# Patient Record
Sex: Male | Born: 1990
Health system: Southern US, Community
[De-identification: ages and names within clinical notes are randomized; demographics above are authoritative.]

## PROBLEM LIST (undated history)

## (undated) DIAGNOSIS — F909 Attention-deficit hyperactivity disorder, unspecified type: Secondary | ICD-10-CM

## (undated) DIAGNOSIS — F418 Other specified anxiety disorders: Secondary | ICD-10-CM

## (undated) DIAGNOSIS — K649 Unspecified hemorrhoids: Secondary | ICD-10-CM

## (undated) DIAGNOSIS — F458 Other somatoform disorders: Secondary | ICD-10-CM

## (undated) DIAGNOSIS — L0591 Pilonidal cyst without abscess: Secondary | ICD-10-CM

## (undated) DIAGNOSIS — K760 Fatty (change of) liver, not elsewhere classified: Secondary | ICD-10-CM

## (undated) DIAGNOSIS — I10 Essential (primary) hypertension: Secondary | ICD-10-CM

## (undated) DIAGNOSIS — J45909 Unspecified asthma, uncomplicated: Secondary | ICD-10-CM

## (undated) HISTORY — DX: Pilonidal cyst without abscess: L05.91

## (undated) HISTORY — DX: Other specified anxiety disorders: F41.8

## (undated) HISTORY — PX: CYSTECTOMY: SUR359

## (undated) HISTORY — DX: Attention-deficit hyperactivity disorder, unspecified type: F90.9

## (undated) HISTORY — PX: OTHER SURGICAL HISTORY: SHX169

## (undated) HISTORY — PX: APPENDECTOMY: SHX54

## (undated) HISTORY — PX: WISDOM TOOTH EXTRACTION: SHX21

## (undated) HISTORY — DX: Essential (primary) hypertension: I10

## (undated) HISTORY — DX: Fatty (change of) liver, not elsewhere classified: K76.0

## (undated) HISTORY — DX: Other somatoform disorders: F45.8

## (undated) HISTORY — DX: Unspecified asthma, uncomplicated: J45.909

## (undated) HISTORY — DX: Unspecified hemorrhoids: K64.9

---

## 2013-02-09 ENCOUNTER — Telehealth: Payer: Self-pay

## 2013-02-09 NOTE — Telephone Encounter (Signed)
Left message for call back. Identifiable.   

## 2013-02-09 NOTE — Telephone Encounter (Signed)
Patient not available at the time of phone.  Wife, Raymond Santana, said to call him back later today around 4pm.

## 2013-02-10 ENCOUNTER — Ambulatory Visit: Payer: Self-pay | Admitting: Family Medicine

## 2013-02-14 NOTE — Telephone Encounter (Signed)
Appt. cancelled and rescheduled for 02/18/13 @ 1:30pm.

## 2013-02-17 ENCOUNTER — Telehealth: Payer: Self-pay

## 2013-02-17 NOTE — Telephone Encounter (Signed)
Medication List and allergies:  Updated and Reviewed  90 day supply/mail order: n/a Local prescriptions:  [facility] in [place], [place]  Immunization due: Influenza-declined  A/P: Personal, family or PSH: Updated Flu- Declined Tdap-10/07/2010    To discuss with provider: Lower back pain x 6 months

## 2013-02-18 ENCOUNTER — Encounter: Payer: Self-pay | Admitting: Family Medicine

## 2013-02-18 ENCOUNTER — Ambulatory Visit (INDEPENDENT_AMBULATORY_CARE_PROVIDER_SITE_OTHER): Payer: 59 | Admitting: Family Medicine

## 2013-02-18 VITALS — BP 129/84 | HR 100 | Temp 98.2°F | Resp 16 | Ht 72.0 in | Wt 295.5 lb

## 2013-02-18 DIAGNOSIS — M545 Low back pain, unspecified: Secondary | ICD-10-CM

## 2013-02-18 MED ORDER — METHYLPREDNISOLONE ACETATE 80 MG/ML IJ SUSP
80.0000 mg | Freq: Once | INTRAMUSCULAR | Status: AC
  Administered 2013-02-18: 80 mg via INTRAMUSCULAR

## 2013-02-18 MED ORDER — METHOCARBAMOL 750 MG PO TABS: 750.0000 mg | ORAL_TABLET | Freq: Three times a day (TID) | ORAL | Status: DC

## 2013-02-18 MED ORDER — PREDNISONE 10 MG PO TABS: ORAL_TABLET | ORAL | Status: DC

## 2013-02-18 NOTE — Assessment & Plan Note (Signed)
New.  Pt's sxs have been worsening x6 months.  Start Prednisone taper, muscle relaxers, refer to ortho.  Reviewed supportive care and red flags that should prompt return.  Pt expressed understanding and is in agreement w/ plan.

## 2013-02-18 NOTE — Progress Notes (Signed)
Pre visit review using our clinic review tool, if applicable. No additional management support is needed unless otherwise documented below in the visit note. 

## 2013-02-18 NOTE — Patient Instructions (Signed)
Schedule your complete physical at your convenience Start the Prednisone tomorrow morning- take w/ food Use the Robaxin as needed for pain Alternate ice/heat for pain relief We'll call you with your ortho appt Call with any questions or concerns Hang in there!!

## 2013-02-18 NOTE — Progress Notes (Signed)
   Subjective:    Patient ID: Raymond Santana, male    DOB: 10/01/1990, 23 y.o.   MRN: [id number]  HPI New to establish.  Back pain- pt was lifting heavy TV ~6 months ago and thought he pulled a muscle.  Pain has since worsened.  Now having severe pain w/ forward flexion, sitting, lifting.  Baseline pain is ~5 but will shoot to 9-10 w/ certain movements, 'will take my breath away'.  Using icyhot, tiger balm, heating pad w/ minimal relief.  Pain is radiating from low back upwards.  No weakness or numbness of legs.  No bowel or bladder incontinence.  Pt reports ibuprofen has been ineffective.  Took mom's muscle relaxer yesterday (thinks it was Flexeril) w/o relief.   Review of Systems For ROS see HPI     Objective:   Physical Exam  Vitals reviewed. Constitutional: He is oriented to person, place, and time. He appears well-developed and well-nourished.  Obviously uncomfortable  Musculoskeletal: He exhibits tenderness (over spine at L1, pain w/ flexion worse than extension.  unable to sit w/o pain.  (-) SLR). He exhibits no edema.  Neurological: He is alert and oriented to person, place, and time. He has normal reflexes. No cranial nerve deficit. Coordination normal.  Skin: Skin is warm and dry.          Assessment & Plan:

## 2013-02-23 ENCOUNTER — Telehealth: Payer: Self-pay

## 2013-02-23 NOTE — Telephone Encounter (Signed)
The patient's wife called and stated she believed the pt has a chiropractor apt sometime today.  She does not know when or where the apt is, so she is hoping whoever made the apt could call the pt back with more info , thanks!    Husband call back - [phone]

## 2013-02-23 NOTE — Telephone Encounter (Signed)
Spoke w/pt and gave him appt/contact info.

## 2013-09-15 ENCOUNTER — Encounter: Payer: Self-pay | Admitting: Medical

## 2013-09-15 ENCOUNTER — Ambulatory Visit (INDEPENDENT_AMBULATORY_CARE_PROVIDER_SITE_OTHER): Payer: 59 | Admitting: Medical

## 2013-09-15 VITALS — BP 117/82 | HR 95 | Temp 97.8°F | Ht 71.5 in | Wt 286.6 lb

## 2013-09-15 DIAGNOSIS — J029 Acute pharyngitis, unspecified: Secondary | ICD-10-CM

## 2013-09-15 DIAGNOSIS — R062 Wheezing: Secondary | ICD-10-CM

## 2013-09-15 LAB — POCT RAPID STREP A (OFFICE): RAPID STREP A SCREEN: NEGATIVE

## 2013-09-15 MED ORDER — ALBUTEROL SULFATE HFA 108 (90 BASE) MCG/ACT IN AERS
2.0000 | INHALATION_SPRAY | Freq: Four times a day (QID) | RESPIRATORY_TRACT | Status: DC | PRN
Start: 2013-09-15 — End: 2015-02-02

## 2013-09-15 MED ORDER — CEFDINIR 300 MG PO CAPS: 300.0000 mg | ORAL_CAPSULE | Freq: Two times a day (BID) | ORAL | Status: DC

## 2013-09-15 NOTE — Assessment & Plan Note (Signed)
History of asthma. Lungs sounded clear on exam presently. Prescription of albuterol. I advised him that if he gets worse wheezing or if he has to take albuterol every 6 hours to notify us.

## 2013-09-15 NOTE — Patient Instructions (Signed)
Your appear have probable strep throat based on your symptoms and appearance by exam. I prescribed cefdinir and sent med  to your pharmacy. For your mild wheeze last night and history of asthma, I am prescribing albuterol. If you have to use inhaler every 6 hours then notify us and would call in additional medications. Follow up in 7 days or as needed.

## 2013-09-15 NOTE — Assessment & Plan Note (Signed)
Based on his exam and clinical presentation I decided to go ahead and give him cefdinir antibiotic. This is despite rapid strep test negative.

## 2013-09-15 NOTE — Progress Notes (Signed)
   Subjective:    Patient ID: Raymond Santana, male    DOB: 1990-12-12, 23 y.o.   MRN: [id number]  HPI  Pt states that his throat feels sore. Hurts to swallow. Symptoms came on yesterday. Pt does have body aches and fatigue. No nasal congestion. No sore throat in family or friends.   Pt states feels like wheezing a little bit. He has history of asthma. Last excacerbation 10 years ago.    Review of Systems  Constitutional: Positive for chills and fatigue. Negative for fever.  HENT: Positive for sore throat. Negative for drooling, facial swelling, mouth sores, nosebleeds, postnasal drip, rhinorrhea, sinus pressure, sneezing, tinnitus, trouble swallowing and voice change.   Respiratory: Positive for wheezing. Negative for cough and choking.        Mild wheeze last night. None presently and he does have a history of asthma  Cardiovascular: Negative for chest pain.  Gastrointestinal: Negative.   Genitourinary: Negative.   Musculoskeletal: Negative for arthralgias, back pain and neck pain.  Neurological: Negative.   Hematological: Negative for adenopathy. Does not bruise/bleed easily.       Objective:   Physical Exam  General-no acute distress, pleasant patient. Although during the exam when he swallows he appears to have some pain. Neck-full range of motion, no nuchal rigidity. Bilateral mild tender submandibular nodes. Also in the mid anterior cervical node region on the right side he has a slight tender area on palpation but I do not feel a lymph node. Lungs-clear even and unlabored Heart-regular, rate and rhythm HEENT-no frontal or maxillary sinus pressure to palpation. Ear exams-canals are clear and TMs are normal. Posterior pharynx exam shows moderate hypertrophy with bright redness but no exudate. Uvula midline. Neurologic exam-cranial nerves III through XII grossly intact.         Assessment & Plan:

## 2014-02-06 ENCOUNTER — Encounter: Payer: Self-pay | Admitting: Medical

## 2014-02-06 ENCOUNTER — Ambulatory Visit (INDEPENDENT_AMBULATORY_CARE_PROVIDER_SITE_OTHER): Payer: Self-pay | Admitting: Medical

## 2014-02-06 VITALS — BP 128/85 | HR 110 | Temp 99.2°F | Ht 71.5 in | Wt 278.4 lb

## 2014-02-06 DIAGNOSIS — J020 Streptococcal pharyngitis: Secondary | ICD-10-CM

## 2014-02-06 DIAGNOSIS — J029 Acute pharyngitis, unspecified: Secondary | ICD-10-CM

## 2014-02-06 DIAGNOSIS — R509 Fever, unspecified: Secondary | ICD-10-CM

## 2014-02-06 MED ORDER — PENICILLIN G BENZATHINE 1200000 UNIT/2ML IM SUSP
1.2000 10*6.[IU] | Freq: Once | INTRAMUSCULAR | Status: AC
  Administered 2014-02-06: 1.2 10*6.[IU] via INTRAMUSCULAR

## 2014-02-06 MED ORDER — AZITHROMYCIN 250 MG PO TABS: ORAL_TABLET | ORAL | Status: DC

## 2014-02-06 NOTE — Progress Notes (Signed)
Pre visit review using our clinic review tool, if applicable. No additional management support is needed unless otherwise documented below in the visit note. 

## 2014-02-06 NOTE — Patient Instructions (Signed)
Your strep test was positive. I am prescribing bicillin in office. And rx of azithromycin to start tomorrow.  Rest hydrate, tylenol for fever and warm salt water gargles. Follow up in 7 days or as needed.

## 2014-02-06 NOTE — Assessment & Plan Note (Addendum)
Your strep test was positive. I am prescribing bicillin in office. And rx of azithromycin to start tomorrow.  Rest hydrate, tylenol for fever and warm salt water gargles

## 2014-02-06 NOTE — Progress Notes (Signed)
Subjective:    Patient ID: Raymond Santana, male    DOB: [birth year], 24 y.o.   MRN: [id number]  HPI  Pt has sore throat for 2  days.  Associated symptom.  Body aches-yes Fever-yes Chills-yes HA-Moderate ha Neck symptoms-pain anterior neck radiating to his ears. Lymph node enlargement-yes Rash-no Painful swallowing-yes Recent strep contact-no       Review of Systems  Constitutional: Positive for fever, chills and fatigue.  HENT: Positive for sore throat.   Respiratory: Negative for cough, choking, shortness of breath and wheezing.   Cardiovascular: Negative for chest pain and palpitations.  Gastrointestinal: Negative for abdominal pain.  Musculoskeletal: Negative for back pain and neck pain.  Hematological: Positive for adenopathy. Does not bruise/bleed easily.  Psychiatric/Behavioral: Negative for behavioral problems and confusion.   Past Medical History  Diagnosis Date  . Asthma   . Hypertension     History   Social History  . Marital Status: Married    Spouse Name: N/A    Number of Children: N/A  . Years of Education: N/A   Occupational History  . Not on file.   Social History Main Topics  . Smoking status: Never Smoker   . Smokeless tobacco: Never Used  . Alcohol Use: Yes  . Drug Use: No  . Sexual Activity: Yes   Other Topics Concern  . Not on file   Social History Narrative    Past Surgical History  Procedure Laterality Date  . Appendectomy    . Cystectomy      Buttock  . Staph infection      L toe    Family History  Problem Relation Age of Onset  . Diabetes      father's side of family  . Diabetes Mother     mother's side of family  . Hypertension Mother     mother's side of family  . Colon cancer Paternal Grandmother   . Cancer Other     great grandmother    No Known Allergies  Current Outpatient Prescriptions on File Prior to Visit  Medication Sig Dispense Refill  . albuterol (PROVENTIL HFA;VENTOLIN HFA) 108 (90  BASE) MCG/ACT inhaler Inhale 2 puffs into the lungs every 6 (six) hours as needed for wheezing or shortness of breath. 1 Inhaler 0  . cefdinir (OMNICEF) 300 MG capsule Take 1 capsule (300 mg total) by mouth 2 (two) times daily. 20 capsule 0  . ibuprofen (ADVIL,MOTRIN) 200 MG tablet Take 200 mg by mouth every 6 (six) hours as needed.    . methocarbamol (ROBAXIN) 750 MG tablet Take 1 tablet (750 mg total) by mouth 3 (three) times daily. (Patient not taking: Reported on 02/06/2014) 60 tablet 0  . predniSONE (DELTASONE) 10 MG tablet 3 tabs x3 days and then 2 tabs x3 days and then 1 tab x3 days.  Take w/ food. (Patient not taking: Reported on 02/06/2014) 18 tablet 0   No current facility-administered medications on file prior to visit.    BP 128/85 mmHg  Pulse 110  Temp(Src) 99.2 F (37.3 C) (Oral)  Ht 5' 11.5" (1.816 m)  Wt 278 lb 6.4 oz (126.281 kg)  BMI 38.29 kg/m2  SpO2 98%      Objective:   Physical Exam   General- No acute distress, pleasant pt.  Neck- from, No nuccal rigidity, Mild submandibular node hypertrophy.  Lungs- Clear even and unlabored.  Heart- Regular, rate and rhythm. HEENT- Head- normocephalic Eyes- PEERL bilaterally. Ears- Canals clear, normal tm's bilaterally.  Nose- No frontal or maxillary sinus tenderness to palpation. Turbinates normal. Throat- posterior pharynx shows   tonsillar hypertrophy 2+  plus,  bright  erythma,  Discharge some bilateral.   Neurologic- CN III- XII grossly intact.       Assessment & Plan:

## 2014-07-11 ENCOUNTER — Emergency Department (HOSPITAL_BASED_OUTPATIENT_CLINIC_OR_DEPARTMENT_OTHER)
Admission: EM | Admit: 2014-07-11 | Discharge: 2014-07-11 | Disposition: A | Discharge status: Home / Self Care | Payer: 59 | Attending: Emergency Medicine | Admitting: Emergency Medicine

## 2014-07-11 ENCOUNTER — Emergency Department (HOSPITAL_BASED_OUTPATIENT_CLINIC_OR_DEPARTMENT_OTHER): Payer: 59

## 2014-07-11 ENCOUNTER — Ambulatory Visit (INDEPENDENT_AMBULATORY_CARE_PROVIDER_SITE_OTHER): Payer: 59 | Admitting: Family Medicine

## 2014-07-11 ENCOUNTER — Encounter: Payer: Self-pay | Admitting: Family Medicine

## 2014-07-11 ENCOUNTER — Encounter (HOSPITAL_BASED_OUTPATIENT_CLINIC_OR_DEPARTMENT_OTHER): Payer: Self-pay

## 2014-07-11 VITALS — BP 136/82 | HR 128 | Temp 98.8°F | Resp 16 | Wt 290.5 lb

## 2014-07-11 DIAGNOSIS — K529 Noninfective gastroenteritis and colitis, unspecified: Secondary | ICD-10-CM | POA: Diagnosis not present

## 2014-07-11 DIAGNOSIS — R1030 Lower abdominal pain, unspecified: Secondary | ICD-10-CM | POA: Diagnosis not present

## 2014-07-11 DIAGNOSIS — R197 Diarrhea, unspecified: Secondary | ICD-10-CM | POA: Diagnosis present

## 2014-07-11 DIAGNOSIS — J45909 Unspecified asthma, uncomplicated: Secondary | ICD-10-CM | POA: Insufficient documentation

## 2014-07-11 DIAGNOSIS — I10 Essential (primary) hypertension: Secondary | ICD-10-CM | POA: Insufficient documentation

## 2014-07-11 DIAGNOSIS — Z79899 Other long term (current) drug therapy: Secondary | ICD-10-CM | POA: Diagnosis not present

## 2014-07-11 DIAGNOSIS — R000 Tachycardia, unspecified: Secondary | ICD-10-CM | POA: Diagnosis not present

## 2014-07-11 DIAGNOSIS — A09 Infectious gastroenteritis and colitis, unspecified: Secondary | ICD-10-CM | POA: Diagnosis not present

## 2014-07-11 LAB — COMPREHENSIVE METABOLIC PANEL
ALK PHOS: 66 U/L (ref 38–126)
ALT: 64 U/L — AB (ref 17–63)
AST: 40 U/L (ref 15–41)
Albumin: 4.4 g/dL (ref 3.5–5.0)
Anion gap: 14 (ref 5–15)
BUN: 14 mg/dL (ref 6–20)
CO2: 21 mmol/L — AB (ref 22–32)
Calcium: 9.4 mg/dL (ref 8.9–10.3)
Chloride: 99 mmol/L — ABNORMAL LOW (ref 101–111)
Creatinine, Ser: 1.12 mg/dL (ref 0.61–1.24)
GFR calc Af Amer: >60 mL/min (ref >60)
GFR calc non Af Amer: >60 mL/min (ref >60)
Glucose, Bld: 84 mg/dL (ref 65–99)
Potassium: 3.6 mmol/L (ref 3.5–5.1)
SODIUM: 134 mmol/L — AB (ref 135–145)
TOTAL PROTEIN: 8.3 g/dL — AB (ref 6.5–8.1)
Total Bilirubin: 1 mg/dL (ref 0.3–1.2)

## 2014-07-11 LAB — CBC
HCT: 48.3 % (ref 39.0–52.0)
Hemoglobin: 16.5 g/dL (ref 13.0–17.0)
MCH: 28.4 pg (ref 26.0–34.0)
MCHC: 34.2 g/dL (ref 30.0–36.0)
MCV: 83.3 fL (ref 78.0–100.0)
Platelets: 263 10*3/uL (ref 150–400)
RBC: 5.8 MIL/uL (ref 4.22–5.81)
RDW: 14.3 % (ref 11.5–15.5)
WBC: 9.1 10*3/uL (ref 4.0–10.5)

## 2014-07-11 LAB — LIPASE, BLOOD: Lipase: 16 U/L — ABNORMAL LOW (ref 22–51)

## 2014-07-11 LAB — OCCULT BLOOD X 1 CARD TO LAB, STOOL: Fecal Occult Bld: POSITIVE — AB

## 2014-07-11 MED ORDER — IOHEXOL 300 MG/ML  SOLN
100.0000 mL | Freq: Once | INTRAMUSCULAR | Status: AC | PRN
  Administered 2014-07-11: 100 mL via INTRAVENOUS

## 2014-07-11 MED ORDER — DICYCLOMINE HCL 10 MG PO CAPS
10.0000 mg | ORAL_CAPSULE | Freq: Once | ORAL | Status: AC
  Administered 2014-07-11: 10 mg via ORAL
  Filled 2014-07-11: qty 1

## 2014-07-11 MED ORDER — CIPROFLOXACIN HCL 500 MG PO TABS: 500.0000 mg | ORAL_TABLET | Freq: Two times a day (BID) | ORAL | Status: DC

## 2014-07-11 MED ORDER — SODIUM CHLORIDE 0.9 % IV BOLUS (SEPSIS)
500.0000 mL | Freq: Once | INTRAVENOUS | Status: AC
  Administered 2014-07-11: 500 mL via INTRAVENOUS

## 2014-07-11 MED ORDER — SODIUM CHLORIDE 0.9 % IV BOLUS (SEPSIS)
1000.0000 mL | Freq: Once | INTRAVENOUS | Status: AC
  Administered 2014-07-11: 1000 mL via INTRAVENOUS

## 2014-07-11 MED ORDER — IOHEXOL 300 MG/ML  SOLN
25.0000 mL | Freq: Once | INTRAMUSCULAR | Status: AC | PRN
  Administered 2014-07-11: 25 mL via ORAL

## 2014-07-11 MED ORDER — METRONIDAZOLE 500 MG PO TABS: 500.0000 mg | ORAL_TABLET | Freq: Two times a day (BID) | ORAL | Status: DC

## 2014-07-11 NOTE — Discharge Instructions (Signed)
Take both antibiotics as directed for one week. You may also take immodium over the counter. Stay well hydrated.  Diarrhea Diarrhea is frequent loose and watery bowel movements. It can cause you to feel weak and dehydrated. Dehydration can cause you to become tired and thirsty, have a dry mouth, and have decreased urination that often is dark yellow. Diarrhea is a sign of another problem, most often an infection that will not last long. In most cases, diarrhea typically lasts 2-3 days. However, it can last longer if it is a sign of something more serious. It is important to treat your diarrhea as directed by your caregiver to lessen or prevent future episodes of diarrhea. CAUSES  Some common causes include:  Gastrointestinal infections caused by viruses, bacteria, or parasites.  Food poisoning or food allergies.  Certain medicines, such as antibiotics, chemotherapy, and laxatives.  Artificial sweeteners and fructose.  Digestive disorders. HOME CARE INSTRUCTIONS  Ensure adequate fluid intake (hydration): Have 1 cup (8 oz) of fluid for each diarrhea episode. Avoid fluids that contain simple sugars or sports drinks, fruit juices, whole milk products, and sodas. Your urine should be clear or pale yellow if you are drinking enough fluids. Hydrate with an oral rehydration solution that you can purchase at pharmacies, retail stores, and online. You can prepare an oral rehydration solution at home by mixing the following ingredients together:   - tsp table salt.   tsp baking soda.   tsp salt substitute containing potassium chloride.  1  tablespoons sugar.  1 L (34 oz) of water.  Certain foods and beverages may increase the speed at which food moves through the gastrointestinal (GI) tract. These foods and beverages should be avoided and include:  Caffeinated and alcoholic beverages.  High-fiber foods, such as raw fruits and vegetables, nuts, seeds, and whole grain breads and  cereals.  Foods and beverages sweetened with sugar alcohols, such as xylitol, sorbitol, and mannitol.  Some foods may be well tolerated and may help thicken stool including:  Starchy foods, such as rice, toast, pasta, low-sugar cereal, oatmeal, grits, baked potatoes, crackers, and bagels.  Bananas.  Applesauce.  Add probiotic-rich foods to help increase healthy bacteria in the GI tract, such as yogurt and fermented milk products.  Wash your hands well after each diarrhea episode.  Only take over-the-counter or prescription medicines as directed by your caregiver.  Take a warm bath to relieve any burning or pain from frequent diarrhea episodes. SEEK IMMEDIATE MEDICAL CARE IF:   You are unable to keep fluids down.  You have persistent vomiting.  You have blood in your stool, or your stools are black and tarry.  You do not urinate in 6-8 hours, or there is only a small amount of very dark urine.  You have abdominal pain that increases or localizes.  You have weakness, dizziness, confusion, or light-headedness.  You have a severe headache.  Your diarrhea gets worse or does not get better.  You have a fever or persistent symptoms for more than 2-3 days.  You have a fever and your symptoms suddenly get worse. MAKE SURE YOU:   Understand these instructions.  Will watch your condition.  Will get help right away if you are not doing well or get worse. Document Released: 12/13/2001 Document Revised: 05/09/2013 Document Reviewed: 08/31/2011 [facility] Patient Information 2015 [place], [place]. This information is not intended to replace advice given to you by your health care provider. Make sure you discuss any questions you have with your  health care provider.  Bloody Diarrhea Bloody diarrhea can be caused by many different conditions. Most of the time bloody diarrhea is the result of food poisoning or minor infections. Bloody diarrhea usually improves over 2 to 3 days of rest  and fluid replacement. Other conditions that can cause bloody diarrhea include:  Internal bleeding.  Infection.  Diseases of the bowel and colon. Internal bleeding from an ulcer or bowel disease can be severe and requires hospital care or even surgery. DIAGNOSIS  To find out what is wrong your caregiver may check your:  Stool.  Blood.  Results from a test that looks inside the body (endoscopy). TREATMENT   Get plenty of rest.  Drink enough water and fluids to keep your urine clear or pale yellow.  Do not smoke.  Solid foods and dairy products should be avoided until your illness improves.  As you improve, slowly return to a regular diet with easily-digested foods first. Examples are:  Bananas.  Rice.  Toast.  Crackers. You should only need these for about 2 days before adding more normal foods to your diet.  Avoid spicy or fatty foods as well as caffeine and alcohol for several days.  Medicine to control cramping and diarrhea can relieve symptoms but may prolong some cases of bloody diarrhea. Antibiotics can speed recovery from diarrhea due to some bacterial infections. Call your caregiver if diarrhea does not get better in 3 days. SEEK MEDICAL CARE IF:   You do not improve after 3 days.  Your diarrhea improves but your stool appears black. SEEK IMMEDIATE MEDICAL CARE IF:   You become extremely weak or faint.  You become very sweaty.  You have increased pain or bleeding.  You develop repeated vomiting.  You vomit and you see blood or the vomit looks black in color.  You have a fever. Document Released: 12/23/2004 Document Revised: 03/17/2011 Document Reviewed: 11/24/2008 [facility] Patient Information 2015 [place], [place]. This information is not intended to replace advice given to you by your health care provider. Make sure you discuss any questions you have with your health care provider.  Food Choices to Help Relieve Diarrhea When you have diarrhea, the  foods you eat and your eating habits are very important. Choosing the right foods and drinks can help relieve diarrhea. Also, because diarrhea can last up to 7 days, you need to replace lost fluids and electrolytes (such as sodium, potassium, and chloride) in order to help prevent dehydration.  WHAT GENERAL GUIDELINES DO I NEED TO FOLLOW?  Slowly drink 1 cup (8 oz) of fluid for each episode of diarrhea. If you are getting enough fluid, your urine will be clear or pale yellow.  Eat starchy foods. Some good choices include white rice, white toast, pasta, low-fiber cereal, baked potatoes (without the skin), saltine crackers, and bagels.  Avoid large servings of any cooked vegetables.  Limit fruit to two servings per day. A serving is  cup or 1 small piece.  Choose foods with less than 2 g of fiber per serving.  Limit fats to less than 8 tsp (38 g) per day.  Avoid fried foods.  Eat foods that have probiotics in them. Probiotics can be found in certain dairy products.  Avoid foods and beverages that may increase the speed at which food moves through the stomach and intestines (gastrointestinal tract). Things to avoid include:  High-fiber foods, such as dried fruit, raw fruits and vegetables, nuts, seeds, and whole grain foods.  Spicy foods and high-fat  foods.  Foods and beverages sweetened with high-fructose corn syrup, honey, or sugar alcohols such as xylitol, sorbitol, and mannitol. WHAT FOODS ARE RECOMMENDED? Grains White rice. White, [place], or pita breads (fresh or toasted), including plain rolls, buns, or bagels. White pasta. Saltine, soda, or graham crackers. Pretzels. Low-fiber cereal. Cooked cereals made with water (such as cornmeal, farina, or cream cereals). Plain muffins. Matzo. Melba toast. Zwieback.  Vegetables Potatoes (without the skin). Strained tomato and vegetable juices. Most well-cooked and canned vegetables without seeds. Tender lettuce. Fruits Cooked or canned  applesauce, apricots, cherries, fruit cocktail, grapefruit, peaches, pears, or plums. Fresh bananas, apples without skin, cherries, grapes, cantaloupe, grapefruit, peaches, oranges, or plums.  Meat and Other Protein Products Baked or boiled chicken. Eggs. Tofu. Fish. Seafood. Smooth peanut butter. Ground or well-cooked tender beef, ham, veal, lamb, pork, or poultry.  Dairy Plain yogurt, kefir, and unsweetened liquid yogurt. Lactose-free milk, buttermilk, or soy milk. Plain hard cheese. Beverages Sport drinks. Clear broths. Diluted fruit juices (except prune). Regular, caffeine-free sodas such as ginger ale. Water. Decaffeinated teas. Oral rehydration solutions. Sugar-free beverages not sweetened with sugar alcohols. Other Bouillon, broth, or soups made from recommended foods.  The items listed above may not be a complete list of recommended foods or beverages. Contact your dietitian for more options. WHAT FOODS ARE NOT RECOMMENDED? Grains Whole grain, whole wheat, bran, or rye breads, rolls, pastas, crackers, and cereals. Wild or brown rice. Cereals that contain more than 2 g of fiber per serving. Corn tortillas or taco shells. Cooked or dry oatmeal. Granola. Popcorn. Vegetables Raw vegetables. Cabbage, broccoli, Brussels sprouts, artichokes, baked beans, beet greens, corn, kale, legumes, peas, sweet potatoes, and yams. Potato skins. Cooked spinach and cabbage. Fruits Dried fruit, including raisins and dates. Raw fruits. Stewed or dried prunes. Fresh apples with skin, apricots, mangoes, pears, raspberries, and strawberries.  Meat and Other Protein Products Chunky peanut butter. Nuts and seeds. Beans and lentils. Tomasa BlaseBacon.  Dairy High-fat cheeses. Milk, chocolate milk, and beverages made with milk, such as milk shakes. Cream. Ice cream. Sweets and Desserts Sweet rolls, doughnuts, and sweet breads. Pancakes and waffles. Fats and Oils Butter. Cream sauces. Margarine. Salad oils. Plain salad  dressings. Olives. Avocados.  Beverages Caffeinated beverages (such as coffee, tea, soda, or energy drinks). Alcoholic beverages. Fruit juices with pulp. Prune juice. Soft drinks sweetened with high-fructose corn syrup or sugar alcohols. Other Coconut. Hot sauce. Chili powder. Mayonnaise. Gravy. Cream-based or milk-based soups.  The items listed above may not be a complete list of foods and beverages to avoid. Contact your dietitian for more information. WHAT SHOULD I DO IF I BECOME DEHYDRATED? Diarrhea can sometimes lead to dehydration. Signs of dehydration include dark urine and dry mouth and skin. If you think you are dehydrated, you should rehydrate with an oral rehydration solution. These solutions can be purchased at pharmacies, retail stores, or online.  Drink -1 cup (120-240 mL) of oral rehydration solution each time you have an episode of diarrhea. If drinking this amount makes your diarrhea worse, try drinking smaller amounts more often. For example, drink 1-3 tsp (5-15 mL) every 5-10 minutes.  A general rule for staying hydrated is to drink 1-2 L of fluid per day. Talk to your health care provider about the specific amount you should be drinking each day. Drink enough fluids to keep your urine clear or pale yellow. Document Released: 03/15/2003 Document Revised: 12/28/2012 Document Reviewed: 11/15/2012 [facility] Patient Information 2015 [place], [place]. This information is not intended  to replace advice given to you by your health care provider. Make sure you discuss any questions you have with your health care provider.  Colitis Colitis is inflammation of the colon. Colitis can be a short-term or long-standing (chronic) illness. Crohn's disease and ulcerative colitis are 2 types of colitis which are chronic. They usually require lifelong treatment. CAUSES  There are many different causes of colitis, including:  Viruses.  Germs (bacteria).  Medicine reactions. SYMPTOMS    Diarrhea.  Intestinal bleeding.  Pain.  Fever.  Throwing up (vomiting).  Tiredness (fatigue).  Weight loss.  Bowel blockage. DIAGNOSIS  The diagnosis of colitis is based on examination and stool or blood tests. X-rays, CT scan, and colonoscopy may also be needed. TREATMENT  Treatment may include:  Fluids given through the vein (intravenously).  Bowel rest (nothing to eat or drink for a period of time).  Medicine for pain and diarrhea.  Medicines (antibiotics) that kill germs.  Cortisone medicines.  Surgery. HOME CARE INSTRUCTIONS   Get plenty of rest.  Drink enough water and fluids to keep your urine clear or pale yellow.  Eat a well-balanced diet.  Call your caregiver for follow-up as recommended. SEEK IMMEDIATE MEDICAL CARE IF:   You develop chills.  You have an oral temperature above 102 F (38.9 C), not controlled by medicine.  You have extreme weakness, fainting, or dehydration.  You have repeated vomiting.  You develop severe belly (abdominal) pain or are passing bloody or tarry stools. MAKE SURE YOU:   Understand these instructions.  Will watch your condition.  Will get help right away if you are not doing well or get worse. Document Released: 01/31/2004 Document Revised: 03/17/2011 Document Reviewed: 04/27/2009 [facility] Patient Information 2015 [place], [place]. This information is not intended to replace advice given to you by your health care provider. Make sure you discuss any questions you have with your health care provider.

## 2014-07-11 NOTE — Assessment & Plan Note (Signed)
New.  Pt is unable to eat or drink w/o bloody diarrhea.  Given fevers, presence of blood, and lack of others w/ similar sxs, I suspect pt has food poisoning or other infectious process.  Pt is tachycardic to 130 bpm.  Due to this, will send him to ER downstairs for IV fluids and additional tx.  Reviewed supportive care and red flags that should prompt return.  Pt expressed understanding and is in agreement w/ plan.

## 2014-07-11 NOTE — Addendum Note (Signed)
Addended by: Eustace QuailEABOLD, Tabytha Gradillas J on: 07/11/2014 01:43 PM   Modules accepted: Orders

## 2014-07-11 NOTE — ED Notes (Signed)
PA at bedside.

## 2014-07-11 NOTE — ED Notes (Signed)
Pt reports 3 days of diarrhea, initially with fever and chills but not current.  Diffuse abd pain, no n/v.

## 2014-07-11 NOTE — Patient Instructions (Signed)
Please go downstairs for IV fluids and additional treatment We will run the stool studies to definitively know what's causing your symptoms Depending on what the ER prescribes for you, let me know so I can make cost effective adjustments if needed Call with any questions or concerns Hang in there!!!

## 2014-07-11 NOTE — ED Provider Notes (Signed)
CSN: [id number]     Arrival date & time 07/11/14  1346 History   First MD Initiated Contact with Patient 07/11/14 1355     Chief Complaint  Patient presents with  . Diarrhea     (Consider location/radiation/quality/duration/timing/severity/associated sxs/prior Treatment) HPI Comments: 24 year old male presenting with diarrhea 3 days. Reports multiple episodes of mucousy, watery diarrhea that appears like "beef stew" every 5-10 minutes. States he has noticed small streaks of dark red blood. 3 days ago, before the onset of diarrhea, he had [place] food. His wife had the same and is not feeling sick. No recent travel or antibiotic use. Admits to subjective fever and chills along with generalized, cramping abdominal pain, unrelieved by Pepto-Bismol or ibuprofen. Denies nausea or vomiting. Went to his PCPs office prior to coming to the ED who is located upstairs, she collected a stool sample and sent him to the ED for rehydration.  Patient is a 24 y.o. male presenting with diarrhea. The history is provided by the patient.  Diarrhea Quality:  Mucous, watery and blood-tinged Severity:  Moderate Onset quality:  Gradual Number of episodes:  Multiple Duration:  3 days Progression:  Worsening Relieved by:  Nothing Associated symptoms: abdominal pain, chills and fever   Risk factors: no recent antibiotic use, no sick contacts and no travel to endemic areas     Past Medical History  Diagnosis Date  . Asthma   . Hypertension    Past Surgical History  Procedure Laterality Date  . Appendectomy    . Cystectomy      Buttock  . Staph infection      L toe   Family History  Problem Relation Age of Onset  . Diabetes      father's side of family  . Diabetes Mother     mother's side of family  . Hypertension Mother     mother's side of family  . Colon cancer Paternal Grandmother   . Cancer Other     great grandmother   History  Substance Use Topics  . Smoking status: Never Smoker   .  Smokeless tobacco: Never Used  . Alcohol Use: Yes    Review of Systems  Constitutional: Positive for fever and chills.  Gastrointestinal: Positive for abdominal pain and diarrhea.  All other systems reviewed and are negative.     Allergies  Review of patient's allergies indicates no known allergies.  Home Medications   Prior to Admission medications   Medication Sig Start Date End Date Taking? Authorizing Provider  albuterol (PROVENTIL HFA;VENTOLIN HFA) 108 (90 BASE) MCG/ACT inhaler Inhale 2 puffs into the lungs every 6 (six) hours as needed for wheezing or shortness of breath. 09/15/13   Ramon Dredge Saguier, PA-C  ciprofloxacin (CIPRO) 500 MG tablet Take 1 tablet (500 mg total) by mouth 2 (two) times daily. One po bid x 7 days 07/11/14   Kathrynn Speed, PA-C  metroNIDAZOLE (FLAGYL) 500 MG tablet Take 1 tablet (500 mg total) by mouth 2 (two) times daily. One po bid x 7 days 07/11/14   Nada Boozer Shaniqwa Horsman, PA-C   BP 117/70 mmHg  Pulse 103  Temp(Src) 98.6 F (37 C) (Oral)  Resp 20  Ht 6' (1.829 m)  Wt 290 lb 8 oz (131.77 kg)  BMI 39.39 kg/m2  SpO2 100% Physical Exam  Constitutional: He is oriented to person, place, and time. He appears well-developed and well-nourished. No distress.  HENT:  Head: Normocephalic and atraumatic.  Eyes: Conjunctivae and EOM are normal.  Neck: Normal range of motion. Neck supple.  Cardiovascular: Regular rhythm and normal heart sounds.   Tachycardic.  Pulmonary/Chest: Effort normal and breath sounds normal.  Abdominal: Normal appearance and bowel sounds are normal. He exhibits no distension. There is tenderness. There is no rigidity, no rebound, no guarding, no tenderness at McBurney's point and negative Murphy's sign.  Tenderness across lower abdomen. No peritoneal signs.  Musculoskeletal: Normal range of motion. He exhibits no edema.  Neurological: He is alert and oriented to person, place, and time.  Skin: Skin is warm and dry.  Psychiatric: He has a normal  mood and affect. His behavior is normal.  Nursing note and vitals reviewed.   ED Course  Procedures (including critical care time) Labs Review Labs Reviewed  COMPREHENSIVE METABOLIC PANEL - Abnormal; Notable for the following:    Sodium 134 (*)    Chloride 99 (*)    CO2 21 (*)    Total Protein 8.3 (*)    ALT 64 (*)    All other components within normal limits  LIPASE, BLOOD - Abnormal; Notable for the following:    Lipase 16 (*)    All other components within normal limits  OCCULT BLOOD X 1 CARD TO LAB, STOOL - Abnormal; Notable for the following:    Fecal Occult Bld POSITIVE (*)    All other components within normal limits  CBC    Imaging Review Ct Abdomen Pelvis W Contrast  07/11/2014   CLINICAL DATA:  Generalized abdominal pain starting Saturday night, nausea, diarrhea, blood in the stool  EXAM: CT ABDOMEN AND PELVIS WITH CONTRAST  TECHNIQUE: Multidetector CT imaging of the abdomen and pelvis was performed using the standard protocol following bolus administration of intravenous contrast.  CONTRAST:  100mL OMNIPAQUE IOHEXOL 300 MG/ML SOLN, 25mL OMNIPAQUE IOHEXOL 300 MG/ML SOLN  COMPARISON:  10/24/2010  FINDINGS: Lung bases are unremarkable. Sagittal images of the spine are unremarkable. Fatty infiltration of the liver is noted. No focal hepatic mass. No calcified gallstones are noted within gallbladder. The pancreas, spleen and adrenal glands are unremarkable. Kidneys are symmetrical in size and enhancement. No hydronephrosis or hydroureter.  There is no small bowel obstruction. No ascites or free air. There is nonspecific mild thickening of the colonic wall in right colon transverse colon and descending colon. Small nonspecific lymph nodes are noted in right lower quadrant mesentery the largest measures 1.1 cm. Some liquid stool noted in sigmoid colon suspicious for diarrhea. Minimal enhancement of the mucosa sigmoid colon. Diffuse mild colitis cannot be excluded. Clinical correlation is  necessary. There is no colonic obstruction. No pelvic ascites or adenopathy. No inguinal adenopathy. No destructive bony lesions are noted within pelvis.  IMPRESSION: 1. Fatty infiltration of the liver. 2. There is mild thickening of colonic wall in right colon transverse colon and descending colon. Some liquid stool noted within sigmoid colon suspicious for diarrhea. Diffuse mild colitis cannot be excluded. Clinical correlation is necessary. 3. Small nonspecific lymph nodes are noted in right lower quadrant mesentery the largest measures 1.1 cm borderline by size criteria, this may be reactive. 4. No small bowel obstruction. 5. No hydronephrosis or hydroureter. 6. No ascites or free air.   Electronically Signed   By: Natasha MeadLiviu  Pop M.D.   On: 07/11/2014 17:11     EKG Interpretation None      MDM   Final diagnoses:  Colitis  Bloody diarrhea   Nontoxic appearing, NAD. Afebrile. Tachycardic, vitals otherwise stable. Sent from PCP for rehydration and further  evaluation of diarrhea. Stool cultures pending from PCP as stated above. Abdomen is soft with tenderness across lower abdomen, no peritoneal signs. Patient given IV fluids. Labs significant for mildly elevated ALT, no other acute findings. No leukocytosis. Stool hemoccult-positive. Bentyl given for abdominal cramping with great improvement of his symptoms, until patient went to have another bowel movement, and he started to develop cramping, left upper abdominal pain radiating down to his left lower quadrant. On exam, he has tenderness in left lower quadrant. At this time, plan to obtain CT to evaluate for possible diverticulitis. 4:16 PM  5:22 PM CT results as stated above. Will treat with cipro/flagyl. BRAT diet. F/u with PCP within 2-3 days. stable for d/c. Return precautions given. Patient states understanding of treatment care plan and is agreeable.  Kathrynn Speed, PA-C 07/11/14 1724  Raeford Razor, MD 07/12/14 [phone]

## 2014-07-11 NOTE — Progress Notes (Signed)
Pre visit review using our clinic review tool, if applicable. No additional management support is needed unless otherwise documented below in the visit note. 

## 2014-07-11 NOTE — ED Notes (Signed)
Patient transported to CT 

## 2014-07-11 NOTE — Progress Notes (Signed)
   Subjective:    Patient ID: Raymond Santana, male    DOB: 1990-10-27, 24 y.o.   MRN: [id number]  HPI Diarrhea- sxs started Saturday night w/ chills.  Then developed 'really bad' abd cramps.  + fever on Saturday night, doesn't remember the temp.  Has been trying to eat BRAT diet and 'within 5-10 minutes I'm going to the bathroom'.  Pain starts epigastric and radiates into lower abd.  Pain is rated 8-10/10.  No known sick contacts.  Ate Mexican earlier in the evening when sxs started but other diners did not get ill.  + blood in stool.  + nausea, no vomiting.  Pt is stooling every 15 minutes even w/o eating/drinking..   Review of Systems For ROS see HPI     Objective:   Physical Exam  Constitutional: He is oriented to person, place, and time. He appears well-developed and well-nourished. He appears distressed.  HENT:  Head: Normocephalic and atraumatic.  Cardiovascular: Regular rhythm and normal heart sounds.   Tachy but regular  Pulmonary/Chest: Effort normal and breath sounds normal. No respiratory distress. He has no wheezes. He has no rales.  Abdominal: Soft. He exhibits no distension. There is no tenderness. There is no rebound.  Hypoactive bowel sounds  Neurological: He is alert and oriented to person, place, and time.  Skin: Skin is warm and dry.  Psychiatric: He has a normal mood and affect. His behavior is normal.  Vitals reviewed.         Assessment & Plan:

## 2014-07-12 LAB — C. DIFFICILE GDH AND TOXIN A/B
C. difficile GDH: NOT DETECTED
C. difficile Toxin A/B: NOT DETECTED

## 2014-07-12 LAB — FECAL LACTOFERRIN, QUANT: Lactoferrin: POSITIVE

## 2014-07-18 ENCOUNTER — Telehealth: Payer: Self-pay | Admitting: Family Medicine

## 2014-07-18 NOTE — Telephone Encounter (Signed)
Called pt and advised on results.

## 2014-07-18 NOTE — Telephone Encounter (Signed)
Relation to pt: self Call back number: [phone]   Reason for call:  Pt inquiring about stool results

## 2014-08-14 LAB — STOOL CULTURE

## 2015-01-07 DIAGNOSIS — K649 Unspecified hemorrhoids: Secondary | ICD-10-CM

## 2015-01-07 HISTORY — DX: Unspecified hemorrhoids: K64.9

## 2015-02-02 ENCOUNTER — Encounter: Payer: Self-pay | Admitting: Family Medicine

## 2015-02-02 ENCOUNTER — Ambulatory Visit (INDEPENDENT_AMBULATORY_CARE_PROVIDER_SITE_OTHER): Payer: 59 | Admitting: Family Medicine

## 2015-02-02 ENCOUNTER — Other Ambulatory Visit: Payer: Self-pay

## 2015-02-02 VITALS — BP 132/90 | HR 102 | Temp 98.0°F | Ht 72.0 in | Wt 319.6 lb

## 2015-02-02 DIAGNOSIS — Z0000 Encounter for general adult medical examination without abnormal findings: Secondary | ICD-10-CM | POA: Insufficient documentation

## 2015-02-02 LAB — CBC WITH DIFFERENTIAL/PLATELET
BASOS PCT: 0 % (ref 0–1)
Basophils Absolute: 0 10*3/uL (ref 0.0–0.1)
Eosinophils Absolute: 0.1 10*3/uL (ref 0.0–0.7)
Eosinophils Relative: 1 % (ref 0–5)
HCT: 44.4 % (ref 39.0–52.0)
Hemoglobin: 15 g/dL (ref 13.0–17.0)
Lymphocytes Relative: 38 % (ref 12–46)
Lymphs Abs: 3.3 10*3/uL (ref 0.7–4.0)
MCH: 27.8 pg (ref 26.0–34.0)
MCHC: 33.8 g/dL (ref 30.0–36.0)
MCV: 82.4 fL (ref 78.0–100.0)
MONO ABS: 1.1 10*3/uL — AB (ref 0.1–1.0)
MPV: 9.4 fL (ref 8.6–12.4)
Monocytes Relative: 12 % (ref 3–12)
Neutro Abs: 4.3 10*3/uL (ref 1.7–7.7)
Neutrophils Relative %: 49 % (ref 43–77)
PLATELETS: 290 10*3/uL (ref 150–400)
RBC: 5.39 MIL/uL (ref 4.22–5.81)
RDW: 14.5 % (ref 11.5–15.5)
WBC: 8.8 10*3/uL (ref 4.0–10.5)

## 2015-02-02 LAB — HEPATIC FUNCTION PANEL
ALT: 75 U/L — ABNORMAL HIGH (ref 9–46)
AST: 41 U/L — ABNORMAL HIGH (ref 10–40)
Albumin: 4.4 g/dL (ref 3.6–5.1)
Alkaline Phosphatase: 81 U/L (ref 40–115)
Bilirubin, Direct: 0.1 mg/dL (ref <=0.2)
Indirect Bilirubin: 0.2 mg/dL (ref 0.2–1.2)
Total Bilirubin: 0.3 mg/dL (ref 0.2–1.2)
Total Protein: 7.1 g/dL (ref 6.1–8.1)

## 2015-02-02 LAB — BASIC METABOLIC PANEL
BUN: 11 mg/dL (ref 7–25)
CALCIUM: 8.9 mg/dL (ref 8.6–10.3)
CO2: 24 mmol/L (ref 20–31)
Chloride: 105 mmol/L (ref 98–110)
Creat: 0.91 mg/dL (ref 0.60–1.35)
GLUCOSE: 75 mg/dL (ref 65–99)
Potassium: 4.3 mmol/L (ref 3.5–5.3)
SODIUM: 139 mmol/L (ref 135–146)

## 2015-02-02 LAB — TSH: TSH: 1.958 u[IU]/mL (ref 0.350–4.500)

## 2015-02-02 LAB — LIPID PANEL
CHOLESTEROL: 211 mg/dL — AB (ref 125–200)
HDL: 25 mg/dL — AB (ref >=40)
LDL Cholesterol: 130 mg/dL — ABNORMAL HIGH (ref <130)
TRIGLYCERIDES: 281 mg/dL — AB (ref <150)
Total CHOL/HDL Ratio: 8.4 Ratio — ABNORMAL HIGH (ref <=5.0)
VLDL: 56 mg/dL — AB (ref <30)

## 2015-02-02 MED ORDER — ALBUTEROL SULFATE HFA 108 (90 BASE) MCG/ACT IN AERS: 2.0000 | INHALATION_SPRAY | Freq: Four times a day (QID) | RESPIRATORY_TRACT | Status: DC | PRN

## 2015-02-02 MED ORDER — PANTOPRAZOLE SODIUM 40 MG PO TBEC: 40.0000 mg | DELAYED_RELEASE_TABLET | Freq: Every day | ORAL | Status: DC

## 2015-02-02 NOTE — Patient Instructions (Signed)
Schedule a fasting lab visit at your convenience Start the Protonix daily to decrease the acid production Continue to work on healthy diet and regular exercise- you can do it! If your stomach pain changes or worsens- please let me know! Call with any questions or concerns If you want to join [place] at the new [place] office, any scheduled appointments will automatically transfer and we will see you at [address] [place] [address], [place], [place] [address] [facility]) Have a great weekend!!!

## 2015-02-02 NOTE — Progress Notes (Signed)
Pre visit review using our clinic review tool, if applicable. No additional management support is needed unless otherwise documented below in the visit note. 

## 2015-02-02 NOTE — Progress Notes (Signed)
   Subjective:    Patient ID: Raymond Santana, male    DOB: 1990/05/30, 25 y.o.   MRN: [id number]  HPI CPE- pt reports he will need to have a bowel movement ~10 minutes after eating.  Stools are loose.  Occasional cramping.  Pt will have pain under both ribs w/ extended sitting.  + GERD- having sour brash at night x6 months.  Pt attributes a lot of his sxs to weight gain (~30 lbs)   Review of Systems Patient reports no vision/hearing changes, anorexia, fever ,adenopathy, persistant/recurrent hoarseness, swallowing issues, chest pain, palpitations, edema, persistant/recurrent cough, hemoptysis, dyspnea (rest,exertional, paroxysmal nocturnal), gastrointestinal  bleeding (melena, rectal bleeding), GU symptoms (dysuria, hematuria, voiding/incontinence issues) syncope, focal weakness, memory loss, skin/hair/nail changes, depression, anxiety, abnormal bruising/bleeding, musculoskeletal symptoms/signs.   + tingling of L hand and arm in ulnar distribution intermittently    Objective:   Physical Exam General Appearance:    Alert, cooperative, no distress, appears stated age, obese  Head:    Normocephalic, without obvious abnormality, atraumatic  Eyes:    PERRL, conjunctiva/corneas clear, EOM's intact, fundi    benign, both eyes       Ears:    Normal TM's and external ear canals, both ears  Nose:   Nares normal, septum midline, mucosa normal, no drainage   or sinus tenderness  Throat:   Lips, mucosa, and tongue normal; teeth and gums normal  Neck:   Supple, symmetrical, trachea midline, no adenopathy;       thyroid:  No enlargement/tenderness/nodules  Back:     Symmetric, no curvature, ROM normal, no CVA tenderness  Lungs:     Clear to auscultation bilaterally, respirations unlabored  Chest wall:    No tenderness or deformity  Heart:    Regular rate and rhythm, S1 and S2 normal, no murmur, rub   or gallop  Abdomen:     Soft, non-tender, bowel sounds active all four quadrants,    no masses, no  organomegaly  Genitalia:    Pt declined  Rectal:    Extremities:   Extremities normal, atraumatic, no cyanosis or edema  Pulses:   2+ and symmetric all extremities  Skin:   Skin color, texture, turgor normal, no rashes or lesions  Lymph nodes:   Cervical, supraclavicular, and axillary nodes normal  Neurologic:   CNII-XII intact. Normal strength, sensation and reflexes      throughout          Assessment & Plan:

## 2015-02-04 NOTE — Assessment & Plan Note (Signed)
Pt's PE WNL w/ exception of obesity.  Check labs.  Stressed importance of healthy diet and regular exercise.  Anticipatory guidance provided.

## 2015-02-05 ENCOUNTER — Other Ambulatory Visit: Payer: Self-pay

## 2015-02-05 MED ORDER — ALBUTEROL SULFATE HFA 108 (90 BASE) MCG/ACT IN AERS: 2.0000 | INHALATION_SPRAY | Freq: Four times a day (QID) | RESPIRATORY_TRACT | Status: DC | PRN

## 2015-02-07 ENCOUNTER — Telehealth: Payer: Self-pay

## 2015-02-07 NOTE — Telephone Encounter (Signed)
Spoke with Raymond Santana at the pharmacy and she will make the change of the medication to the patient.

## 2015-02-07 NOTE — Telephone Encounter (Signed)
Spoke with provider and he gave Verbal ok to change to [employer].

## 2015-02-09 ENCOUNTER — Encounter: Payer: Self-pay | Admitting: General Practice

## 2015-02-09 ENCOUNTER — Other Ambulatory Visit: Payer: Self-pay | Admitting: Family Medicine

## 2015-02-09 DIAGNOSIS — R7989 Other specified abnormal findings of blood chemistry: Secondary | ICD-10-CM

## 2015-02-09 DIAGNOSIS — R945 Abnormal results of liver function studies: Secondary | ICD-10-CM

## 2015-03-02 ENCOUNTER — Telehealth: Payer: Self-pay | Admitting: Family Medicine

## 2015-03-02 ENCOUNTER — Other Ambulatory Visit: Payer: Self-pay

## 2015-03-02 MED ORDER — PANTOPRAZOLE SODIUM 40 MG PO TBEC: 40.0000 mg | DELAYED_RELEASE_TABLET | Freq: Every day | ORAL | Status: DC

## 2015-03-02 MED ORDER — ALBUTEROL SULFATE HFA 108 (90 BASE) MCG/ACT IN AERS: 2.0000 | INHALATION_SPRAY | Freq: Four times a day (QID) | RESPIRATORY_TRACT | Status: DC | PRN

## 2015-03-02 NOTE — Telephone Encounter (Signed)
Medication filled per patient request. 

## 2015-03-02 NOTE — Telephone Encounter (Signed)
Caller name: Marchelle Folks   Relationship to patient: Spouse   Can be reached: [phone]  Pharmacy: [id number] - [place], [place] - [address]  Reason for call: pt's spouse called in because she says that they now have new insurance on Monday and would like to see if PCP could call in PROTONIX again for the pt.  ALSO, she says that pt need a refill on his albuterol.    Please assist further.

## 2015-03-07 ENCOUNTER — Ambulatory Visit (INDEPENDENT_AMBULATORY_CARE_PROVIDER_SITE_OTHER): Payer: 59 | Admitting: Medical

## 2015-03-07 ENCOUNTER — Encounter: Payer: Self-pay | Admitting: Medical

## 2015-03-07 VITALS — BP 130/88 | HR 120 | Temp 98.5°F | Resp 16 | Ht 72.0 in | Wt 320.0 lb

## 2015-03-07 DIAGNOSIS — R05 Cough: Secondary | ICD-10-CM

## 2015-03-07 DIAGNOSIS — J028 Acute pharyngitis due to other specified organisms: Secondary | ICD-10-CM | POA: Diagnosis not present

## 2015-03-07 DIAGNOSIS — M609 Myositis, unspecified: Secondary | ICD-10-CM

## 2015-03-07 DIAGNOSIS — IMO0001 Reserved for inherently not codable concepts without codable children: Secondary | ICD-10-CM

## 2015-03-07 DIAGNOSIS — B950 Streptococcus, group A, as the cause of diseases classified elsewhere: Secondary | ICD-10-CM

## 2015-03-07 DIAGNOSIS — J111 Influenza due to unidentified influenza virus with other respiratory manifestations: Secondary | ICD-10-CM

## 2015-03-07 DIAGNOSIS — R059 Cough, unspecified: Secondary | ICD-10-CM

## 2015-03-07 DIAGNOSIS — J029 Acute pharyngitis, unspecified: Secondary | ICD-10-CM

## 2015-03-07 DIAGNOSIS — M791 Myalgia: Secondary | ICD-10-CM

## 2015-03-07 LAB — POCT RAPID STREP A (OFFICE): RAPID STREP A SCREEN: POSITIVE — AB

## 2015-03-07 LAB — POCT INFLUENZA A/B
Influenza A, POC: NEGATIVE
Influenza B, POC: NEGATIVE

## 2015-03-07 MED ORDER — OSELTAMIVIR PHOSPHATE 75 MG PO CAPS: 75.0000 mg | ORAL_CAPSULE | Freq: Two times a day (BID) | ORAL | Status: DC

## 2015-03-07 MED ORDER — AZITHROMYCIN 250 MG PO TABS: ORAL_TABLET | ORAL | Status: DC

## 2015-03-07 NOTE — Progress Notes (Signed)
Subjective:    Patient ID: Raymond Santana, male    DOB: 1990-07-22, 25 y.o.   MRN: [id number]  HPI   Pt in for he has some chills,fatigue, diffuse body aches and some sweats at night. Symptoms started yesterday. Monday felt fine and Tuesday illness cam on acute. Pt did not get flu vaccine this year.   Pt has also soar throat. Moderate.     Review of Systems  Constitutional: Positive for chills, diaphoresis and fatigue. Negative for fever.  HENT: Positive for congestion, sinus pressure and sore throat.        St worse than sinus. Sinus pressure only faint.  Respiratory: Negative for cough, chest tightness, shortness of breath and wheezing.   Cardiovascular: Negative for chest pain and palpitations.  Genitourinary: Negative for dysuria.  Musculoskeletal: Positive for myalgias.  Neurological: Negative for dizziness and headaches.  Hematological: Negative for adenopathy. Does not bruise/bleed easily.  Psychiatric/Behavioral: Negative for behavioral problems and confusion.     Past Medical History  Diagnosis Date  . Asthma   . Hypertension     Social History   Social History  . Marital Status: Married    Spouse Name: N/A  . Number of Children: N/A  . Years of Education: N/A   Occupational History  . Not on file.   Social History Main Topics  . Smoking status: Never Smoker   . Smokeless tobacco: Never Used  . Alcohol Use: Yes  . Drug Use: No  . Sexual Activity: Yes   Other Topics Concern  . Not on file   Social History Narrative    Past Surgical History  Procedure Laterality Date  . Appendectomy    . Cystectomy      Buttock  . Staph infection      L toe  . Wisdom tooth extraction Bilateral     Family History  Problem Relation Age of Onset  . Diabetes      father's side of family  . Diabetes Mother     mother's side of family  . Hypertension Mother     mother's side of family  . Colon cancer Paternal Grandmother   . Cancer Other     great  grandmother    No Known Allergies  Current Outpatient Prescriptions on File Prior to Visit  Medication Sig Dispense Refill  . albuterol (PROVENTIL HFA;VENTOLIN HFA) 108 (90 Base) MCG/ACT inhaler Inhale 2 puffs into the lungs every 6 (six) hours as needed for wheezing or shortness of breath. 1 Inhaler 0  . ibuprofen (ADVIL,MOTRIN) 800 MG tablet Take 800 mg by mouth every 8 (eight) hours as needed.    . pantoprazole (PROTONIX) 40 MG tablet Take 1 tablet (40 mg total) by mouth daily. 30 tablet 3   No current facility-administered medications on file prior to visit.    BP 130/88 mmHg  Pulse 120  Temp(Src) 98.5 F (36.9 C) (Oral)  Resp 16  Ht 6' (1.829 m)  Wt 320 lb (145.151 kg)  BMI 43.39 kg/m2  SpO2 98%       Objective:   Physical Exam  General  Mental Status - Alert. General Appearance - Well groomed. Not in acute distress.  Skin Rashes- No Rashes.  HEENT Head- Normal. Ear Auditory Canal - Left- Normal. Right - Normal.Tympanic Membrane- Left- Normal. Right- Normal. Eye Sclera/Conjunctiva- Left- Normal. Right- Normal. Nose & Sinuses Nasal Mucosa- Left-  Boggy and Congested. Right-  Boggy and  Congested.Bilateral faint maxillary and faint  frontal sinus  pressure. Mouth & Throat Lips: Upper Lip- Normal: no dryness, cracking, pallor, cyanosis, or vesicular eruption. Lower Lip-Normal: no dryness, cracking, pallor, cyanosis or vesicular eruption. Buccal Mucosa- Bilateral- No Aphthous ulcers. Oropharynx- No Discharge or Erythema. Tonsils: Characteristics- Bilateral- moderate Erythema and  Congestion. Size/Enlargement- Bilateral- 1=enlargement. Discharge- bilateral-None.  Neck Neck- Supple. No Masses.   Chest and Lung Exam Auscultation: Breath Sounds:-Clear even and unlabored.  Cardiovascular Auscultation:Rythm- Regular, rate and rhythm. Murmurs & Other Heart Sounds:Ausculatation of the heart reveal- No Murmurs.  Lymphatic Head & Neck General Head & Neck  Lymphatics: Bilateral: Description- No Localized lymphadenopathy.      Assessment & Plan:  Your strep test was positive. I am prescribing  Azithromycin antibiotic. Rest hydrate, tylenol for fever and warm salt water gargles. Follow up in 7 days or as needed.   Your flu test was negative.   You should feel significantly better  within 24 hours of taking the antibiotic. If by tomorrow night you still have achiness then start tamiflu. It is possible to have false neg rapid flu test. And you could have both diagnosis.  Use ibuprofen for body aches.  Follow up in 7 days or as needed

## 2015-03-07 NOTE — Progress Notes (Signed)
Pre visit review using our clinic review tool, if applicable. No additional management support is needed unless otherwise documented below in the visit note. 

## 2015-03-07 NOTE — Addendum Note (Signed)
Addended by: Neldon Labella on: 03/07/2015 04:20 PM   Modules accepted: Orders

## 2015-03-07 NOTE — Patient Instructions (Signed)
Your strep test was positive. I am prescribing  Azithromycin antibiotic. Rest hydrate, tylenol for fever and warm salt water gargles. Follow up in 7 days or as needed.   Your flu test was negative.   You should feel significantly better  within 24 hours of taking the antibiotic. If by tomorrow night you still have achiness then start tamiflu. It is possible to have false neg rapid flu test. And you could have both diagnosis.  Use ibuprofen for body aches.  Follow up in 7 days or as needed

## 2015-08-09 ENCOUNTER — Encounter: Payer: Self-pay | Admitting: Family Medicine

## 2015-08-09 ENCOUNTER — Ambulatory Visit (INDEPENDENT_AMBULATORY_CARE_PROVIDER_SITE_OTHER): Payer: 59 | Admitting: Family Medicine

## 2015-08-09 DIAGNOSIS — K649 Unspecified hemorrhoids: Secondary | ICD-10-CM | POA: Diagnosis not present

## 2015-08-09 MED ORDER — DIBUCAINE 1 % EX OINT: TOPICAL_OINTMENT | Freq: Three times a day (TID) | CUTANEOUS | 0 refills | Status: DC | PRN

## 2015-08-09 MED ORDER — HYDROCORTISONE ACE-PRAMOXINE 2.5-1 % RE CREA: 1.0000 "application " | TOPICAL_CREAM | Freq: Three times a day (TID) | RECTAL | 0 refills | Status: DC

## 2015-08-09 NOTE — Progress Notes (Signed)
   Subjective:    Patient ID: Raymond Santana, male    DOB: [birth year], 25 y.o.   MRN: [id number]  HPI BRBPR- pt reports ~1 week ago and for a week's time he had 'excessive' bleeding w/ each BM.  Pt had pain, burning, itching.  Using medicated wipes w/ some relief. Hx of pilonidal cysts which required pain meds which caused constipation which resulted in hemorrhoids about 4 yrs ago.  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  Pt reports blood on toilet tissue and in bowl.     Review of Systems For ROS see HPI     Objective:   Physical Exam  Constitutional: He is oriented to person, place, and time. He appears well-developed and well-nourished. No distress.  obese  HENT:  Head: Normocephalic and atraumatic.  Genitourinary:  Genitourinary Comments: Pt declined DRE today  Neurological: He is alert and oriented to person, place, and time.  Skin: Skin is warm and dry.  Psychiatric: He has a normal mood and affect. His behavior is normal. Thought content normal.  Vitals reviewed.         Assessment & Plan:

## 2015-08-09 NOTE — Patient Instructions (Signed)
Follow up as needed We'll call you with your GI appt Start the Analpram (hydrocortisone-pramoxine combo) to shrink and heal the hemorrhoids Use the Nupercainal as needed for pain/burning/itching Stool softeners to avoid painful BMs Drink plenty of fluids Call with any questions or concerns Hang in there!!

## 2015-08-09 NOTE — Assessment & Plan Note (Signed)
New.  Pt reports 4 yrs of similar issues but wife insisted he be evaluated this time.  Pt reports 'i know what's back there' and declined rectal exam today.  Is willing to have GI referral in case medications do not resolve his issues.  Discussed fiber in diet, stool softeners, increased water intake, regular exercise, and weight loss to improve sxs.  Reviewed supportive care and red flags that should prompt return.  Pt expressed understanding and is in agreement w/ plan.

## 2015-08-09 NOTE — Progress Notes (Signed)
Pre visit review using our clinic review tool, if applicable. No additional management support is needed unless otherwise documented below in the visit note. 

## 2015-08-10 ENCOUNTER — Ambulatory Visit: Payer: 59 | Admitting: Family Medicine

## 2015-08-17 ENCOUNTER — Encounter: Payer: Self-pay | Admitting: Internal Medicine

## 2015-10-12 ENCOUNTER — Ambulatory Visit (INDEPENDENT_AMBULATORY_CARE_PROVIDER_SITE_OTHER): Payer: 59 | Admitting: Family Medicine

## 2015-10-12 ENCOUNTER — Encounter: Payer: Self-pay | Admitting: Family Medicine

## 2015-10-12 VITALS — BP 126/82 | HR 84 | Temp 98.3°F | Resp 16 | Ht 72.0 in | Wt 335.1 lb

## 2015-10-12 DIAGNOSIS — J4521 Mild intermittent asthma with (acute) exacerbation: Secondary | ICD-10-CM | POA: Diagnosis not present

## 2015-10-12 MED ORDER — IPRATROPIUM-ALBUTEROL 0.5-2.5 (3) MG/3ML IN SOLN
3.0000 mL | Freq: Once | RESPIRATORY_TRACT | Status: AC
  Administered 2015-10-12: 3 mL via RESPIRATORY_TRACT

## 2015-10-12 MED ORDER — ALBUTEROL SULFATE HFA 108 (90 BASE) MCG/ACT IN AERS: 2.0000 | INHALATION_SPRAY | Freq: Four times a day (QID) | RESPIRATORY_TRACT | 3 refills | Status: DC | PRN

## 2015-10-12 MED ORDER — PREDNISONE 10 MG PO TABS: ORAL_TABLET | ORAL | 0 refills | Status: DC

## 2015-10-12 NOTE — Progress Notes (Signed)
Pre visit review using our clinic review tool, if applicable. No additional management support is needed unless otherwise documented below in the visit note. 

## 2015-10-12 NOTE — Patient Instructions (Signed)
Follow up as needed Start the Prednisone as directed- take w/ food Use the Albuterol inhaler- 2 puffs every 4-6 hrs as needed for cough, shortness of breath, wheezing Drink plenty of fluids Call with any questions or concerns Hang in there!!!

## 2015-10-12 NOTE — Addendum Note (Signed)
Addended by: Geannie RisenBRODMERKEL, JESSICA L on: 10/12/2015 02:36 PM   Modules accepted: Orders

## 2015-10-12 NOTE — Progress Notes (Signed)
   Subjective:    Patient ID: Raymond Santana, male    DOB: 08-26-1990, 26 y.o.   MRN: [id number]  HPI Cough- pt reports that 1 week ago he got over a cold and is feeling much better w/ exception of a 'really aggressive cough'.  Pt is coughing to the point of vomiting, abdominal soreness, and a few days ago had syncopal episode w/ coughing fit.  Pt has hx of asthma and will have increased SOB and wheezing w/ the cough.  Using albuterol inhaler w/ some relief.  No relief w/ OTC cough meds.  No fevers.  Cough is not productive.   Review of Systems For ROS see HPI     Objective:   Physical Exam  Constitutional: He is oriented to person, place, and time. He appears well-developed and well-nourished. No distress.  HENT:  Head: Normocephalic and atraumatic.  Right Ear: Tympanic membrane normal.  Left Ear: Tympanic membrane normal.  Nose: No mucosal edema or rhinorrhea. Right sinus exhibits no maxillary sinus tenderness and no frontal sinus tenderness. Left sinus exhibits no maxillary sinus tenderness and no frontal sinus tenderness.  Mouth/Throat: Mucous membranes are normal. No oropharyngeal exudate, posterior oropharyngeal edema or posterior oropharyngeal erythema.  Eyes: Conjunctivae and EOM are normal. Pupils are equal, round, and reactive to light.  Neck: Normal range of motion. Neck supple.  Cardiovascular: Normal rate, regular rhythm and normal heart sounds.   Pulmonary/Chest: Effort normal and breath sounds normal. No respiratory distress. He has no wheezes.  + hacking cough  Lymphadenopathy:    He has no cervical adenopathy.  Neurological: He is alert and oriented to person, place, and time.  Skin: Skin is warm and dry.  Psychiatric: He has a normal mood and affect. His behavior is normal. Thought content normal.  Vitals reviewed.         Assessment & Plan:  Reactive airway exacerbation- new.  Pt has recovered from his recent respiratory illness but is struggling w/ a  reactive/inflammatory cough.  sxs improved s/p duoneb in office.  Start pred taper.  Albuterol HFA given for home use.  Reviewed supportive care and red flags that should prompt return.  Pt expressed understanding and is in agreement w/ plan.

## 2015-10-26 ENCOUNTER — Ambulatory Visit (INDEPENDENT_AMBULATORY_CARE_PROVIDER_SITE_OTHER): Payer: 59 | Admitting: Internal Medicine

## 2015-10-26 ENCOUNTER — Encounter: Payer: Self-pay | Admitting: Internal Medicine

## 2015-10-26 VITALS — BP 118/86 | HR 100 | Ht 71.0 in | Wt 330.0 lb

## 2015-10-26 DIAGNOSIS — K602 Anal fissure, unspecified: Secondary | ICD-10-CM | POA: Diagnosis not present

## 2015-10-26 MED ORDER — DILTIAZEM GEL 2 %: 1.0000 "application " | Freq: Every day | CUTANEOUS | 3 refills | Status: DC

## 2015-10-26 NOTE — Progress Notes (Signed)
HISTORY OF PRESENT ILLNESS:  Raymond Santana is a 25 y.o. male who is referred by his primary care physician Dr. Beverely Lowabori regarding rectal bleeding and rectal pain. He reports problems beginning in late July. Describes frequent rectal pain with defecation and bright red blood. He was given Analpram previously with some improvement. He typically has one to 2 bowel movements per day. No abdominal pain, diarrhea, blood in the stool, weight loss, fevers, or other complaints. He is accompanied by his wife  REVIEW OF SYSTEMS:  All non-GI ROS negative upon comprehensive review  Past Medical History:  Diagnosis Date  . Asthma   . Hemorrhoid 2017  . Hypertension   . Pilonidal cyst     Past Surgical History:  Procedure Laterality Date  . APPENDECTOMY    . CYSTECTOMY     Buttock  . Staph infection     L toe  . WISDOM TOOTH EXTRACTION Bilateral     Social History Raymond Santana  reports that he has never smoked. He has never used smokeless tobacco. He reports that he drinks alcohol. He reports that he does not use drugs.  family history includes Colon cancer in his paternal grandmother; Diabetes in his mother and other; Hypertension in his mother.  No Known Allergies     PHYSICAL EXAMINATION: Vital signs: BP 118/86 (BP Location: Left Arm, Patient Position: Sitting, Cuff Size: Large)   Pulse 100   Ht 5\' 11"  (1.803 m) Comment: measure without shoes  Wt (!) 330 lb (149.7 kg)   BMI 46.03 kg/m   Constitutional: generally well-appearing, no acute distress Psychiatric: alert and oriented x3, cooperative Eyes: extraocular movements intact, anicteric, conjunctiva pink Mouth: oral pharynx moist, no lesions Neck: supple no lymphadenopathy Cardiovascular: heart regular rate and rhythm, no murmur Lungs: clear to auscultation bilaterally Abdomen: soft, nontender, nondistended, no obvious ascites, no peritoneal signs, normal bowel sounds, no organomegaly Rectal:No external abnormalities.  Tender posterior rectal fissure. Brown stool Extremities: noClubbing cyanosis or lower extremity edema bilaterally Skin: no lesions on visible extremities Neuro: No focal deficits. No asterixis.   ASSESSMENT:  #1. Anal fissure. Acute  PLAN:  #1. Metamucil 2 tablespoons daily #2. Sitz baths daily #3. 2% diltiazem gel 5 times daily. Patient instructed on the proper way to apply. #4. 6 week course of the above. The majority of patients will respond. If not, surgical referral an option.  A copy of this consultation note has been sent to Dr. Beverely Lowabori

## 2015-10-26 NOTE — Patient Instructions (Signed)
We [facility] the [facility] for you to pick up at your convenience: Diltazem   Take 1-2 tablespoons of Metamucil daily  Use Sitz baths

## 2015-12-05 ENCOUNTER — Other Ambulatory Visit: Payer: Self-pay | Admitting: Family Medicine

## 2016-01-14 ENCOUNTER — Encounter: Payer: Self-pay | Admitting: Family Medicine

## 2016-01-14 ENCOUNTER — Telehealth: Payer: Self-pay | Admitting: Family Medicine

## 2016-01-14 ENCOUNTER — Other Ambulatory Visit: Payer: Self-pay | Admitting: General Practice

## 2016-01-14 ENCOUNTER — Ambulatory Visit (INDEPENDENT_AMBULATORY_CARE_PROVIDER_SITE_OTHER): Payer: 59 | Admitting: Family Medicine

## 2016-01-14 VITALS — BP 116/78 | HR 97 | Temp 98.2°F | Resp 16 | Ht 71.0 in | Wt 331.4 lb

## 2016-01-14 DIAGNOSIS — J029 Acute pharyngitis, unspecified: Secondary | ICD-10-CM

## 2016-01-14 LAB — POCT RAPID STREP A (OFFICE): RAPID STREP A SCREEN: NEGATIVE

## 2016-01-14 MED ORDER — AMOXICILLIN 875 MG PO TABS: 875.0000 mg | ORAL_TABLET | Freq: Two times a day (BID) | ORAL | 0 refills | Status: DC

## 2016-01-14 NOTE — Telephone Encounter (Signed)
Pt wife called. Rx was sent to the wrong pharmacy for husband. Please send rx from today to:   Pharmacy:  [facility] [address] - [place], [place] - [address] [phone] (Phone) [phone] (Fax)   Please call wife at [phone] once done. Thank you.

## 2016-01-14 NOTE — Telephone Encounter (Signed)
[facility]ed [facility] and advised that they could restock the medication. Med resent to [facility].

## 2016-01-14 NOTE — Progress Notes (Signed)
   Subjective:    Patient ID: Raymond Santana, male    DOB: Feb 02, 1990, 26 y.o.   MRN: [id number]  HPI Sore throat- sxs started 1-2 days ago.  No fevers.  Painful to swallow.  + sick contacts.  + mild body aches, HA.  Intermittent cough- wet but not productive.  No ear pain.  Pt has hx of recurrent strep.   Review of Systems For ROS see HPI     Objective:   Physical Exam  Constitutional: He is oriented to person, place, and time. He appears well-developed and well-nourished. No distress.  obese  HENT:  Head: Normocephalic and atraumatic.  Nose: Nose normal.  Mouth/Throat: Oropharyngeal exudate (L tonsillar exudate w/ pharyngeal erythema) present.  + TTP over maxillary sinuses  Eyes: Conjunctivae and EOM are normal. Pupils are equal, round, and reactive to light.  Neck: Normal range of motion. Neck supple.  Pulmonary/Chest: Effort normal and breath sounds normal. No respiratory distress. He has no wheezes. He has no rales.  Lymphadenopathy:    He has cervical adenopathy (mild).  Neurological: He is alert and oriented to person, place, and time.  Skin: Skin is warm and dry.  Psychiatric: He has a normal mood and affect. His behavior is normal. Thought content normal.  Vitals reviewed.         Assessment & Plan:

## 2016-01-14 NOTE — Assessment & Plan Note (Signed)
Pt's sxs and PE consistent w/ infxn despite (-) rapid strep test in office.  Due to recent exposure, will tx w/ Amoxicillin.  Reviewed supportive care and red flags that should prompt return.  Pt expressed understanding and is in agreement w/ plan.

## 2016-01-14 NOTE — Patient Instructions (Signed)
Follow up as needed Start the Amoxicillin twice daily- take w/ food Drink plenty of fluids Ibuprofen for pain/fever REST! Call with any questions or concerns Hang in there!!

## 2016-01-14 NOTE — Progress Notes (Signed)
Pre visit review using our clinic review tool, if applicable. No additional management support is needed unless otherwise documented below in the visit note. 

## 2016-04-03 ENCOUNTER — Other Ambulatory Visit: Payer: Self-pay | Admitting: Family Medicine

## 2016-04-03 ENCOUNTER — Ambulatory Visit (INDEPENDENT_AMBULATORY_CARE_PROVIDER_SITE_OTHER): Payer: 59 | Admitting: Family Medicine

## 2016-04-03 ENCOUNTER — Encounter: Payer: Self-pay | Admitting: Family Medicine

## 2016-04-03 VITALS — BP 121/76 | HR 80 | Temp 98.3°F | Resp 16 | Ht 71.0 in | Wt 318.1 lb

## 2016-04-03 DIAGNOSIS — G5622 Lesion of ulnar nerve, left upper limb: Secondary | ICD-10-CM

## 2016-04-03 DIAGNOSIS — Z0000 Encounter for general adult medical examination without abnormal findings: Secondary | ICD-10-CM

## 2016-04-03 DIAGNOSIS — R739 Hyperglycemia, unspecified: Secondary | ICD-10-CM | POA: Diagnosis not present

## 2016-04-03 LAB — CBC WITH DIFFERENTIAL/PLATELET
BASOS ABS: 0 {cells}/uL (ref 0–200)
Basophils Relative: 0 %
EOS PCT: 1 %
Eosinophils Absolute: 112 cells/uL (ref 15–500)
HCT: 50.4 % — ABNORMAL HIGH (ref 38.5–50.0)
HEMOGLOBIN: 16.7 g/dL (ref 13.2–17.1)
LYMPHS ABS: 3248 {cells}/uL (ref 850–3900)
Lymphocytes Relative: 29 %
MCH: 27.6 pg (ref 27.0–33.0)
MCHC: 33.1 g/dL (ref 32.0–36.0)
MCV: 83.3 fL (ref 80.0–100.0)
MPV: 9.8 fL (ref 7.5–12.5)
Monocytes Absolute: 672 cells/uL (ref 200–950)
Monocytes Relative: 6 %
NEUTROS PCT: 64 %
Neutro Abs: 7168 cells/uL (ref 1500–7800)
Platelets: 290 10*3/uL (ref 140–400)
RBC: 6.05 MIL/uL — ABNORMAL HIGH (ref 4.20–5.80)
RDW: 14.6 % (ref 11.0–15.0)
WBC: 11.2 10*3/uL — ABNORMAL HIGH (ref 3.8–10.8)

## 2016-04-03 LAB — HEPATIC FUNCTION PANEL
ALBUMIN: 4.6 g/dL (ref 3.6–5.1)
ALT: 60 U/L — AB (ref 9–46)
AST: 30 U/L (ref 10–40)
Alkaline Phosphatase: 88 U/L (ref 40–115)
BILIRUBIN DIRECT: 0.1 mg/dL (ref <=0.2)
Indirect Bilirubin: 0.4 mg/dL (ref 0.2–1.2)
TOTAL PROTEIN: 7.8 g/dL (ref 6.1–8.1)
Total Bilirubin: 0.5 mg/dL (ref 0.2–1.2)

## 2016-04-03 LAB — BASIC METABOLIC PANEL
BUN: 10 mg/dL (ref 7–25)
CALCIUM: 9.8 mg/dL (ref 8.6–10.3)
CHLORIDE: 104 mmol/L (ref 98–110)
CO2: 20 mmol/L (ref 20–31)
CREATININE: 0.97 mg/dL (ref 0.60–1.35)
GLUCOSE: 131 mg/dL — AB (ref 65–99)
Potassium: 4.1 mmol/L (ref 3.5–5.3)
Sodium: 139 mmol/L (ref 135–146)

## 2016-04-03 LAB — LIPID PANEL
CHOLESTEROL: 195 mg/dL (ref <200)
HDL: 36 mg/dL — AB (ref >40)
LDL CALC: 107 mg/dL — AB (ref <100)
TRIGLYCERIDES: 262 mg/dL — AB (ref <150)
Total CHOL/HDL Ratio: 5.4 Ratio — ABNORMAL HIGH (ref <5.0)
VLDL: 52 mg/dL — ABNORMAL HIGH (ref <30)

## 2016-04-03 LAB — TSH: TSH: 1.39 mIU/L (ref 0.40–4.50)

## 2016-04-03 NOTE — Progress Notes (Signed)
   Subjective:    Patient ID: Raymond Santana, male    DOB: 04/30/1990, 26 y.o.   MRN: [id number]  HPI CPE- UTD on Tdap.  Pt has lost 13 lbs since last visit.     Review of Systems Patient reports no vision/hearing changes, anorexia, fever ,adenopathy, persistant/recurrent hoarseness, swallowing issues, chest pain, palpitations, edema, persistant/recurrent cough, hemoptysis, dyspnea (rest,exertional, paroxysmal nocturnal), gastrointestinal  bleeding (melena, rectal bleeding), abdominal pain, excessive heart burn, GU symptoms (dysuria, hematuria, voiding/incontinence issues) syncope, focal weakness, memory loss, skin/hair/nail changes, depression, anxiety, abnormal bruising/bleeding, musculoskeletal symptoms/signs.   + L ulnar neuropathy    Objective:   Physical Exam General Appearance:    Alert, cooperative, no distress, appears stated age, obese  Head:    Normocephalic, without obvious abnormality, atraumatic  Eyes:    PERRL, conjunctiva/corneas clear, EOM's intact, fundi    benign, both eyes       Ears:    Normal TM's and external ear canals, both ears  Nose:   Nares normal, septum midline, mucosa normal, no drainage   or sinus tenderness  Throat:   Lips, mucosa, and tongue normal; teeth and gums normal  Neck:   Supple, symmetrical, trachea midline, no adenopathy;       thyroid:  No enlargement/tenderness/nodules  Back:     Symmetric, no curvature, ROM normal, no CVA tenderness  Lungs:     Clear to auscultation bilaterally, respirations unlabored  Chest wall:    No tenderness or deformity  Heart:    Regular rate and rhythm, S1 and S2 normal, no murmur, rub   or gallop  Abdomen:     Soft, non-tender, bowel sounds active all four quadrants,    no masses, no organomegaly  Genitalia:    Normal male without lesion, masses,discharge or tenderness  Rectal:    Deferred due to young age  Extremities:   Extremities normal, atraumatic, no cyanosis or edema  Pulses:   2+ and symmetric all  extremities  Skin:   Skin color, texture, turgor normal, no rashes or lesions  Lymph nodes:   Cervical, supraclavicular, and axillary nodes normal  Neurologic:   CNII-XII intact. Normal strength, sensation and reflexes      throughout          Assessment & Plan:

## 2016-04-03 NOTE — Assessment & Plan Note (Signed)
Pt's PE WNL w/ exception of obesity.  UTD on Tdap.  Stressed need for healthy diet and regular exercise.  Check labs.  Anticipatory guidance provided.

## 2016-04-03 NOTE — Patient Instructions (Signed)
Follow up in 1 year or as needed We'll notify you of your lab results and make any changes if needed Continue to work on healthy diet and regular exercise- you can do it! Call with any questions or concerns Happy Odis Lusteraster!!!

## 2016-04-03 NOTE — Progress Notes (Signed)
Pre visit review using our clinic review tool, if applicable. No additional management support is needed unless otherwise documented below in the visit note. 

## 2016-04-04 ENCOUNTER — Other Ambulatory Visit: Payer: Self-pay | Admitting: Family Medicine

## 2016-04-07 LAB — HEMOGLOBIN A1C
HEMOGLOBIN A1C: 6.1 % — AB (ref <5.7)
MEAN PLASMA GLUCOSE: 128 mg/dL

## 2016-04-09 ENCOUNTER — Other Ambulatory Visit: Payer: Self-pay | Admitting: Family Medicine

## 2016-04-09 DIAGNOSIS — D72829 Elevated white blood cell count, unspecified: Secondary | ICD-10-CM

## 2016-04-15 ENCOUNTER — Encounter: Payer: Self-pay | Admitting: General Practice

## 2016-04-16 DIAGNOSIS — G5622 Lesion of ulnar nerve, left upper limb: Secondary | ICD-10-CM | POA: Diagnosis not present

## 2016-04-25 ENCOUNTER — Other Ambulatory Visit: Payer: 59

## 2016-05-02 ENCOUNTER — Other Ambulatory Visit: Payer: 59

## 2016-05-28 DIAGNOSIS — G5622 Lesion of ulnar nerve, left upper limb: Secondary | ICD-10-CM | POA: Diagnosis not present

## 2016-06-30 ENCOUNTER — Other Ambulatory Visit: Payer: Self-pay | Admitting: General Practice

## 2016-06-30 MED ORDER — PANTOPRAZOLE SODIUM 40 MG PO TBEC: 40.0000 mg | DELAYED_RELEASE_TABLET | Freq: Every day | ORAL | 1 refills | Status: DC

## 2016-10-28 ENCOUNTER — Ambulatory Visit (INDEPENDENT_AMBULATORY_CARE_PROVIDER_SITE_OTHER): Payer: 59 | Admitting: Physician Assistant

## 2016-10-28 ENCOUNTER — Encounter: Payer: Self-pay | Admitting: Physician Assistant

## 2016-10-28 VITALS — BP 122/80 | HR 114 | Temp 98.6°F | Resp 14 | Ht 71.0 in | Wt 334.0 lb

## 2016-10-28 DIAGNOSIS — K2100 Gastro-esophageal reflux disease with esophagitis, without bleeding: Secondary | ICD-10-CM

## 2016-10-28 DIAGNOSIS — R000 Tachycardia, unspecified: Secondary | ICD-10-CM | POA: Diagnosis not present

## 2016-10-28 LAB — COMPREHENSIVE METABOLIC PANEL
ALBUMIN: 4.7 g/dL (ref 3.5–5.2)
ALT: 73 U/L — ABNORMAL HIGH (ref 0–53)
AST: 32 U/L (ref 0–37)
Alkaline Phosphatase: 85 U/L (ref 39–117)
BILIRUBIN TOTAL: 0.6 mg/dL (ref 0.2–1.2)
BUN: 9 mg/dL (ref 6–23)
CALCIUM: 10.1 mg/dL (ref 8.4–10.5)
CHLORIDE: 100 meq/L (ref 96–112)
CO2: 32 mEq/L (ref 19–32)
CREATININE: 0.85 mg/dL (ref 0.40–1.50)
GFR: 114.35 mL/min (ref >60.00)
Glucose, Bld: 121 mg/dL — ABNORMAL HIGH (ref 70–99)
Potassium: 4.1 mEq/L (ref 3.5–5.1)
SODIUM: 137 meq/L (ref 135–145)
TOTAL PROTEIN: 7.7 g/dL (ref 6.0–8.3)

## 2016-10-28 LAB — CBC WITH DIFFERENTIAL/PLATELET
BASOS ABS: 0 10*3/uL (ref 0.0–0.1)
BASOS PCT: 0.2 % (ref 0.0–3.0)
EOS ABS: 0.1 10*3/uL (ref 0.0–0.7)
Eosinophils Relative: 0.6 % (ref 0.0–5.0)
HCT: 49.7 % (ref 39.0–52.0)
Hemoglobin: 16.3 g/dL (ref 13.0–17.0)
LYMPHS ABS: 3 10*3/uL (ref 0.7–4.0)
Lymphocytes Relative: 19.5 % (ref 12.0–46.0)
MCHC: 32.8 g/dL (ref 30.0–36.0)
MCV: 84.5 fl (ref 78.0–100.0)
Monocytes Absolute: 1 10*3/uL (ref 0.1–1.0)
Monocytes Relative: 6.8 % (ref 3.0–12.0)
NEUTROS PCT: 72.9 % (ref 43.0–77.0)
Neutro Abs: 11.2 10*3/uL — ABNORMAL HIGH (ref 1.4–7.7)
PLATELETS: 334 10*3/uL (ref 150.0–400.0)
RBC: 5.88 Mil/uL — ABNORMAL HIGH (ref 4.22–5.81)
RDW: 14.2 % (ref 11.5–15.5)
WBC: 15.4 10*3/uL — ABNORMAL HIGH (ref 4.0–10.5)

## 2016-10-28 LAB — LIPASE: LIPASE: 17 U/L (ref 11.0–59.0)

## 2016-10-28 LAB — H. PYLORI ANTIBODY, IGG: H PYLORI IGG: NEGATIVE

## 2016-10-28 MED ORDER — GI COCKTAIL ~~LOC~~
30.0000 mL | Freq: Once | ORAL | Status: AC
  Administered 2016-10-28: 30 mL via ORAL

## 2016-10-28 MED ORDER — SUCRALFATE 1 G PO TABS
1.0000 g | ORAL_TABLET | Freq: Three times a day (TID) | ORAL | 0 refills | Status: DC
Start: 2016-10-28 — End: 2016-12-26

## 2016-10-28 MED ORDER — RANITIDINE HCL 300 MG PO TABS: 300.0000 mg | ORAL_TABLET | Freq: Every day | ORAL | 1 refills | Status: DC

## 2016-10-28 NOTE — Patient Instructions (Signed)
Please go to the lab for blood work.  I will call you with your results.  Continue the Protonix once daily. Add on the Zantax 300 mg nightly.  Use the Carafate as directed. Follow-up 1 week.   We will alter regimen based on lab results. Tylenol for pain. No alcohol of NSAIDS (Ibuprofen, Aleve).  ER for any worsening symptoms.   I am setting you up with a Gastroenterologist for further assessment.

## 2016-10-28 NOTE — Progress Notes (Signed)
Patient presents to clinic today c/o increase in reflux symptoms with heart burn and indigestions despite use of his Protonix. States this has been present over the past week and is mainly a problem at night when he lays down. States last night it was so severe that it made him throw up once. He has felt tired today. Has not had anything to eat or drink today. Did take Aleve this AM for a mild headache. Denies abdominal pain, residual nausea, melena, hematochezia, constipation or diarrhea. Denies recent change in diet or stress levels. Notes some odynophagia when reflux is severe. Denies recent travel or sick contact. Does have + family history of Barrett's esophagus. Is follow-up by GI but for unrelated issues.    Past Medical History:  Diagnosis Date  . Asthma   . Hemorrhoid 2017  . Hypertension   . Pilonidal cyst     Current Outpatient Prescriptions on File Prior to Visit  Medication Sig Dispense Refill  . albuterol (PROVENTIL HFA;VENTOLIN HFA) 108 (90 Base) MCG/ACT inhaler Inhale 2 puffs into the lungs every 6 (six) hours as needed for wheezing or shortness of breath. 1 Inhaler 3  . Fexofenadine HCl (ALLEGRA PO) Take 1 tablet by mouth daily.    . pantoprazole (PROTONIX) 40 MG tablet Take 1 tablet (40 mg total) by mouth daily. 90 tablet 1   No current facility-administered medications on file prior to visit.     No Known Allergies  Family History  Problem Relation Age of Onset  . Diabetes Mother        mother's side of family  . Hypertension Mother        mother's side of family  . Diabetes Other        father's side of family, Paternal [place] aunt  . Colon cancer Paternal Grandmother     Social History   Social History  . Marital status: Married    Spouse name: N/A  . Number of children: 1  . Years of education: N/A   Occupational History  . Systems analystsoftware developer    Social History Main Topics  . Smoking status: Never Smoker  . Smokeless tobacco: Never Used  . Alcohol  use Yes     Comment: ocassionally  . Drug use: No  . Sexual activity: Yes   Other Topics Concern  . None   Social History Narrative  . None   Review of Systems - See HPI.  All other ROS are negative.  BP 122/80   Pulse (!) 114   Temp 98.6 F (37 C) (Oral)   Resp 14   Ht 5\' 11"  (1.803 m)   Wt (!) 334 lb (151.5 kg)   SpO2 97%   BMI 46.58 kg/m   Physical Exam  Constitutional: He is oriented to person, place, and time and well-developed, well-nourished, and in no distress.  HENT:  Head: Normocephalic and atraumatic.  Right Ear: External ear normal.  Left Ear: External ear normal.  Nose: Nose normal.  Mouth/Throat: Oropharynx is clear and moist. No oropharyngeal exudate.  TM within normal limits bilaterally.  Eyes: Conjunctivae are normal.  Neck: Neck supple.  Cardiovascular: Normal rate, regular rhythm, normal heart sounds and intact distal pulses.   Pulmonary/Chest: Effort normal and breath sounds normal. No respiratory distress. He has no wheezes. He has no rales. He exhibits no tenderness.  Abdominal: Soft. Bowel sounds are normal. He exhibits no distension and no mass. There is no tenderness. There is no rebound and no guarding.  Neurological: He is alert and oriented to person, place, and time.  Skin: Skin is warm and dry. No rash noted.  Psychiatric: Affect normal.  Vitals reviewed.  Assessment/Plan: 1. GERD with esophagitis Will check labs today. GI cocktail given with improvement in symptoms. Continue Protonix. Add on 300 mg ranitidine QHS. Start short-term trial of carafate. GERD diet reviewed. Supportive measures reviewed. Alarm signs/symptoms prompting need for ER assessment reviewed with both patient and wife.  - CBC with Differential/Platelet - Comprehensive metabolic panel - Lipase - H. pylori antibody, IgG - gi cocktail (Maalox,Lidocaine,Donnatal); Take 30 mLs by mouth once.  2. Tachycardia Secondary to mild dehydration as most likely cause. Discussed  gentle hydration. Labs today.   Piedad Climes, PA-C

## 2016-10-28 NOTE — Progress Notes (Signed)
Pre visit review using our clinic review tool, if applicable. No additional management support is needed unless otherwise documented below in the visit note. 

## 2016-11-02 ENCOUNTER — Other Ambulatory Visit: Payer: Self-pay | Admitting: Family Medicine

## 2016-11-19 ENCOUNTER — Other Ambulatory Visit: Payer: Self-pay

## 2016-11-19 ENCOUNTER — Encounter: Payer: Self-pay | Admitting: Physician Assistant

## 2016-11-19 ENCOUNTER — Ambulatory Visit: Payer: 59 | Admitting: Physician Assistant

## 2016-11-19 VITALS — BP 118/70 | HR 120 | Temp 100.1°F | Resp 16 | Ht 71.0 in | Wt 337.0 lb

## 2016-11-19 DIAGNOSIS — R000 Tachycardia, unspecified: Secondary | ICD-10-CM

## 2016-11-19 DIAGNOSIS — R0602 Shortness of breath: Secondary | ICD-10-CM | POA: Diagnosis not present

## 2016-11-19 DIAGNOSIS — J029 Acute pharyngitis, unspecified: Secondary | ICD-10-CM | POA: Diagnosis not present

## 2016-11-19 DIAGNOSIS — J020 Streptococcal pharyngitis: Secondary | ICD-10-CM

## 2016-11-19 LAB — POCT RAPID STREP A (OFFICE): RAPID STREP A SCREEN: POSITIVE — AB

## 2016-11-19 LAB — POC INFLUENZA A&B (BINAX/QUICKVUE)
INFLUENZA A, POC: NEGATIVE
INFLUENZA B, POC: NEGATIVE

## 2016-11-19 MED ORDER — AMOXICILLIN 875 MG PO TABS: 875.0000 mg | ORAL_TABLET | Freq: Two times a day (BID) | ORAL | 0 refills | Status: DC

## 2016-11-19 NOTE — Patient Instructions (Signed)
Please stay well-hydrated and get plenty of rest. It is very important for you to stay well-hydrated and keep fever under control. Tylenol for fever. Take the antibiotic as directed with food.  If you note any worsening symptoms or any shortness of breath or palpitations, please go to the ER.

## 2016-11-19 NOTE — Progress Notes (Signed)
Patient presents to clinic today c/o sore throat, aches, chills and fever x 1 days. Denies recent travel or known sick contact. States he has noted white patches on his tonsils. Has not hydrated well today. Has not taken anything for fever. Noted Tmax 101 at home. Patient denies chest congestion, chest pain or SOB.  Past Medical History:  Diagnosis Date  . Asthma   . Hemorrhoid 2017  . Hypertension   . Pilonidal cyst     Current Outpatient Medications on File Prior to Visit  Medication Sig Dispense Refill  . albuterol (PROVENTIL HFA;VENTOLIN HFA) 108 (90 Base) MCG/ACT inhaler Inhale 2 puffs into the lungs every 6 (six) hours as needed for wheezing or shortness of breath. 1 Inhaler 3  . Fexofenadine HCl (ALLEGRA PO) Take 1 tablet by mouth daily.    . pantoprazole (PROTONIX) 40 MG tablet TAKE 1 TABLET BY MOUTH  DAILY 90 tablet 1  . ranitidine (ZANTAC) 300 MG tablet Take 1 tablet (300 mg total) by mouth at bedtime. 30 tablet 1  . sucralfate (CARAFATE) 1 g tablet Take 1 tablet (1 g total) by mouth 4 (four) times daily -  with meals and at bedtime. 30 tablet 0   No current facility-administered medications on file prior to visit.     No Known Allergies  Family History  Problem Relation Age of Onset  . Diabetes Mother        mother's side of family  . Hypertension Mother        mother's side of family  . Diabetes Other        father's side of family, Paternal [place] aunt  . Colon cancer Paternal Grandmother     Social History   Socioeconomic History  . Marital status: Married    Spouse name: None  . Number of children: 1  . Years of education: None  . Highest education level: None  Social Needs  . Financial resource strain: None  . Food insecurity - worry: None  . Food insecurity - inability: None  . Transportation needs - medical: None  . Transportation needs - non-medical: None  Occupational History  . Occupation: Systems analystsoftware developer  Tobacco Use  . Smoking status:  Never Smoker  . Smokeless tobacco: Never Used  Substance and Sexual Activity  . Alcohol use: Yes    Comment: ocassionally  . Drug use: No  . Sexual activity: Yes  Other Topics Concern  . None  Social History Narrative  . None   Review of Systems - See HPI.  All other ROS are negative.  BP 118/70   Pulse (!) 129   Temp 100.1 F (37.8 C) (Oral)   Resp 16   Ht 5\' 11"  (1.803 m)   Wt (!) 337 lb (152.9 kg)   SpO2 99%   BMI 47.00 kg/m   Physical Exam  Constitutional: He is oriented to person, place, and time and well-developed, well-nourished, and in no distress.  HENT:  Head: Normocephalic and atraumatic.  Right Ear: Tympanic membrane normal.  Left Ear: Tympanic membrane normal.  Nose: Nose normal.  Mouth/Throat: Uvula is midline and mucous membranes are normal. Oropharyngeal exudate and posterior oropharyngeal erythema present. No posterior oropharyngeal edema or tonsillar abscesses.  Eyes: Conjunctivae are normal.  Neck: Neck supple.  Cardiovascular: Regular rhythm, normal heart sounds and intact distal pulses.  Pulmonary/Chest: Effort normal and breath sounds normal. No respiratory distress. He has no wheezes. He has no rales. He exhibits no tenderness.  Lymphadenopathy:  He has no cervical adenopathy.  Neurological: He is alert and oriented to person, place, and time.  Skin: Skin is warm and dry. No rash noted.  Psychiatric: Affect normal.  Vitals reviewed.   Recent Results (from the past 2160 hour(s))  CBC with Differential/Platelet     Status: Abnormal   Collection Time: 10/28/16  2:49 PM  Result Value Ref Range   WBC 15.4 (H) 4.0 - 10.5 K/uL   RBC 5.88 (H) 4.22 - 5.81 Mil/uL   Hemoglobin 16.3 13.0 - 17.0 g/dL   HCT 81.149.7 91.439.0 - 78.252.0 %   MCV 84.5 78.0 - 100.0 fl   MCHC 32.8 30.0 - 36.0 g/dL   RDW 95.614.2 21.311.5 - 08.615.5 %   Platelets 334.0 150.0 - 400.0 K/uL   Neutrophils Relative % 72.9 43.0 - 77.0 %   Lymphocytes Relative 19.5 12.0 - 46.0 %   Monocytes Relative  6.8 3.0 - 12.0 %   Eosinophils Relative 0.6 0.0 - 5.0 %   Basophils Relative 0.2 0.0 - 3.0 %   Neutro Abs 11.2 (H) 1.4 - 7.7 K/uL   Lymphs Abs 3.0 0.7 - 4.0 K/uL   Monocytes Absolute 1.0 0.1 - 1.0 K/uL   Eosinophils Absolute 0.1 0.0 - 0.7 K/uL   Basophils Absolute 0.0 0.0 - 0.1 K/uL  Comprehensive metabolic panel     Status: Abnormal   Collection Time: 10/28/16  2:49 PM  Result Value Ref Range   Sodium 137 135 - 145 mEq/L   Potassium 4.1 3.5 - 5.1 mEq/L   Chloride 100 96 - 112 mEq/L   CO2 32 19 - 32 mEq/L   Glucose, Bld 121 (H) 70 - 99 mg/dL   BUN 9 6 - 23 mg/dL   Creatinine, Ser 5.780.85 0.40 - 1.50 mg/dL   Total Bilirubin 0.6 0.2 - 1.2 mg/dL   Alkaline Phosphatase 85 39 - 117 U/L   AST 32 0 - 37 U/L   ALT 73 (H) 0 - 53 U/L   Total Protein 7.7 6.0 - 8.3 g/dL   Albumin 4.7 3.5 - 5.2 g/dL   Calcium 46.910.1 8.4 - 62.910.5 mg/dL   GFR 528.41114.35 >32.44>60.00 mL/min  Lipase     Status: None   Collection Time: 10/28/16  2:49 PM  Result Value Ref Range   Lipase 17.0 11.0 - 59.0 U/L  H. pylori antibody, IgG     Status: None   Collection Time: 10/28/16  2:49 PM  Result Value Ref Range   H Pylori IgG Negative Negative  POCT rapid strep A     Status: Abnormal   Collection Time: 11/19/16 11:33 AM  Result Value Ref Range   Rapid Strep A Screen Positive (A) Negative  POC Influenza A&B(BINAX/QUICKVUE)     Status: Normal   Collection Time: 11/19/16 11:46 AM  Result Value Ref Range   Influenza A, POC Negative Negative   Influenza B, POC Negative Negative   Assessment/Plan: 1. Strep throat Rapid strep +. Flu swab negative. Start Amoxicillin 875 mg BID x 10 days. Supportive measures and OTC medications reviewed. - POCT rapid strep A - amoxicillin (AMOXIL) 875 MG tablet; Take 1 tablet (875 mg total) 2 (two) times daily by mouth.  Dispense: 20 tablet; Refill: 0  2. Tachycardia Improved with hydration. Still elevated resting heart rate today. Seems baseline is in the 90s-100. Otherwise asymptomatic. Patient  to take antipyretic and continue hydration at home. Strict ER precautions given if this is not improving.   - POC Influenza  A&B(BINAX/QUICKVUE)   Piedad Climes, PA-C

## 2016-11-19 NOTE — Progress Notes (Signed)
Pre visit review using our clinic review tool, if applicable. No additional management support is needed unless otherwise documented below in the visit note. 

## 2016-12-23 ENCOUNTER — Encounter: Payer: Self-pay | Admitting: Internal Medicine

## 2016-12-23 ENCOUNTER — Ambulatory Visit: Payer: 59 | Admitting: Internal Medicine

## 2016-12-23 VITALS — BP 108/70 | HR 92 | Ht 71.0 in | Wt 335.0 lb

## 2016-12-23 DIAGNOSIS — K219 Gastro-esophageal reflux disease without esophagitis: Secondary | ICD-10-CM | POA: Diagnosis not present

## 2016-12-23 NOTE — Progress Notes (Signed)
HISTORY OF PRESENT ILLNESS:  Raymond BalChristopher T Santana is a 26 y.o. male with hypertension, asthma, and morbid obesity with BMI of 46.7 who is referred by Malva Coganody Martin PA-C regarding chronic GERD. Patient reports at least a 2 year history of problems with GERD as manifested by regurgitation and pyrosis. Occasional dysphagia. He was placed on PPI therapy with good results. However, in recent months he has had significant symptoms, particularly at night. He describes episodes of regurgitation with coughing and choking. In addition to his pantoprazole he was prescribed sucralfate. This has helped. As well as he has adjusted his lifestyle watching meal portions and affording late-night meals. He states the symptoms have improved though still present. She tells me that his father has a history of Barrett's esophagus which concerns him. The patient does not smoke. He has been seen previously for problems with symptomatic anal fissure. He was treated with resolution of this problem. GI review of systems is otherwise negative. He has gained 5 pounds since his last visit 2 years ago. Review of blood work from October 2018 finds elevated ALT at 73. Otherwise normal liver tests. Elevated glucose 121. CBC with hemoglobin 16.3. Last hemoglobin A1c in March was 6.1. Abdominal CT scan July 2016 to evaluate abdominal pain was performed with contrast. This was negative.  REVIEW OF SYSTEMS:  All non-GI ROS negative unless otherwise stated in the history of present illness except for sinus and allergy trouble  Past Medical History:  Diagnosis Date  . Asthma   . Hemorrhoid 2017  . Hypertension   . Pilonidal cyst     Past Surgical History:  Procedure Laterality Date  . APPENDECTOMY    . CYSTECTOMY     Buttock  . Staph infection     L toe  . WISDOM TOOTH EXTRACTION Bilateral     Social History Raymond Santana  reports that  has never smoked. he has never used smokeless tobacco. He reports that he drinks alcohol. He  reports that he does not use drugs.  family history includes Colon cancer in his paternal grandmother; Diabetes in his mother and other; Hypertension in his mother.  No Known Allergies     PHYSICAL EXAMINATION: Vital signs: BP 108/70   Pulse 92   Ht 5\' 11"  (1.803 m)   Wt (!) 335 lb (152 kg)   BMI 46.72 kg/m   Constitutional: Pleasant, obese, generally well-appearing, no acute distress Psychiatric: alert and oriented x3, cooperative Eyes: extraocular movements intact, anicteric, conjunctiva pink Mouth: oral pharynx moist, no lesions Neck: supple no lymphadenopathy Cardiovascular: heart regular rate and rhythm, no murmur Lungs: clear to auscultation bilaterally Abdomen: soft, obese, nontender, nondistended, no obvious ascites, no peritoneal signs, normal bowel sounds, no organomegaly Rectal: Omitted Extremities: no clubbing, cyanosis, or lower extremity edema bilaterally Skin: no lesions on visible extremities Neuro: No focal deficits. Cranial nerves intact  ASSESSMENT:  #1. GERD. Significant symptoms, particularly nocturnal. Occasional mild dysphagia #2. Family history of Barrett's esophagus in his father #3. Morbid obesity. Significantly contributing to this problem #4. Remote history of anal fissure. Resolved   PLAN:  #1. Discussion on GERD. Supplemental literature provided for his review #2. Reflux precautions. Some weight loss, avoiding large meals, and avoiding meals 4 hours prior to bedtime. Reflux precautions literature also provided for his review #3. Continue pantoprazole 40 mg daily #4. Upper endoscopy to evaluate chronic GERD. As well, Mild dysphagia, and screen for Barretts given his family history. The patient is HIGH RISK GIVEN HIS BODY HABITUS.The  nature of the procedure, as well as the risks, benefits, and alternatives were carefully and thoroughly reviewed with the patient. Ample time for discussion and questions allowed. The patient understood, was satisfied,  and agreed to proceed.  A copy of this consultation note has been sent to Mr. Daphine DeutscherMartin

## 2016-12-23 NOTE — Patient Instructions (Signed)
You have been scheduled for an endoscopy. Please follow written instructions given to you at your visit today. If you use inhalers (even only as needed), please bring them with you on the day of your procedure. Your physician has requested that you go to www.startemmi.com and enter the access code given to you at your visit today. This web site gives a general overview about your procedure. However, you should still follow specific instructions given to you by our office regarding your preparation for the procedure.   Continue with your PPI

## 2016-12-26 ENCOUNTER — Telehealth: Payer: Self-pay | Admitting: Internal Medicine

## 2016-12-26 MED ORDER — SUCRALFATE 1 G PO TABS: 1.0000 g | ORAL_TABLET | Freq: Three times a day (TID) | ORAL | 3 refills | Status: DC

## 2016-12-26 MED ORDER — RANITIDINE HCL 300 MG PO TABS: 300.0000 mg | ORAL_TABLET | Freq: Every day | ORAL | 6 refills | Status: DC

## 2016-12-26 NOTE — Telephone Encounter (Signed)
Okay to refill? 

## 2016-12-26 NOTE — Telephone Encounter (Signed)
Patient is referring to the Ranitidine and Carafate that had previously been filled by Dr. Beverely Lowabori.  Is it ok to refill these for him?

## 2016-12-26 NOTE — Telephone Encounter (Signed)
[facility]

## 2017-01-08 ENCOUNTER — Ambulatory Visit: Payer: 59 | Admitting: Family Medicine

## 2017-01-08 ENCOUNTER — Other Ambulatory Visit: Payer: Self-pay

## 2017-01-08 ENCOUNTER — Encounter: Payer: Self-pay | Admitting: Family Medicine

## 2017-01-08 VITALS — BP 130/90 | HR 82 | Temp 98.3°F | Resp 17 | Ht 71.0 in | Wt 338.5 lb

## 2017-01-08 DIAGNOSIS — J029 Acute pharyngitis, unspecified: Secondary | ICD-10-CM | POA: Diagnosis not present

## 2017-01-08 LAB — POCT RAPID STREP A (OFFICE): RAPID STREP A SCREEN: POSITIVE — AB

## 2017-01-08 MED ORDER — AMOXICILLIN 875 MG PO TABS: 875.0000 mg | ORAL_TABLET | Freq: Two times a day (BID) | ORAL | 0 refills | Status: DC

## 2017-01-08 NOTE — Patient Instructions (Signed)
Follow up as needed or as scheduled This is strep- boo! Start the Amoxicillin twice daily- take w/ food Drink plenty of fluids REST! Ibuprofen for pain/fever Call with any questions or concerns Hang in there! Happy New Year!

## 2017-01-08 NOTE — Progress Notes (Signed)
   Subjective:    Patient ID: Raymond Santana, male    DOB: [birth year], 27 y.o.   MRN: [id number]  HPI Sore throat- pt has hx of recurrent strep.  sxs started ~1-2 days ago w/ body aches, subjective fever.  Has had viral URI in the household and he initially thought it was drainage causing the sore throat.  Wife noticed white patches this AM.   Review of Systems For ROS see HPI     Objective:   Physical Exam  Constitutional: He is oriented to person, place, and time. He appears well-developed and well-nourished. No distress.  obese  HENT:  Head: Normocephalic and atraumatic.  Beefy red tonsils w/o obvious exudate, + strep breath  Neck: Normal range of motion. Neck supple.  Pulmonary/Chest: Effort normal and breath sounds normal. No respiratory distress. He has no wheezes. He has no rales.  Lymphadenopathy:    He has no cervical adenopathy.  Neurological: He is alert and oriented to person, place, and time.  Skin: Skin is warm and dry.  Psychiatric: He has a normal mood and affect. His behavior is normal. Thought content normal.  Vitals reviewed.         Assessment & Plan:  Strep throat- pt's rapid test is + confirming his suspicions.  Start Amox.  Reviewed supportive care and red flags that should prompt return.  Pt expressed understanding and is in agreement w/ plan.

## 2017-01-30 ENCOUNTER — Encounter: Payer: Self-pay | Admitting: Internal Medicine

## 2017-02-13 ENCOUNTER — Encounter: Payer: 59 | Admitting: Internal Medicine

## 2017-03-05 IMAGING — CT CT ABD-PELV W/ CM
2 of 4 series · 15 of 46 positions shown, 17 images · IV contrast (APPLIED)
Comparison: 10/24/2010

CLINICAL DATA: Generalized abdominal pain starting [REDACTED] night,
nausea, diarrhea, blood in the stool

EXAM:
CT ABDOMEN AND PELVIS WITH CONTRAST
TECHNIQUE: Multidetector CT imaging of the abdomen and pelvis was performed
using the standard protocol following bolus administration of
intravenous contrast.
CONTRAST:  100mL OMNIPAQUE IOHEXOL 300 MG/ML SOLN, 25mL OMNIPAQUE
IOHEXOL 300 MG/ML SOLN

[Series 2: abd/pelvis 5.0 b31f · axial · 0.94mm/px · z∈[-566,-76]mm · 12 of 108 slices shown, 14 images]
[im 5/108  soft-tissue]
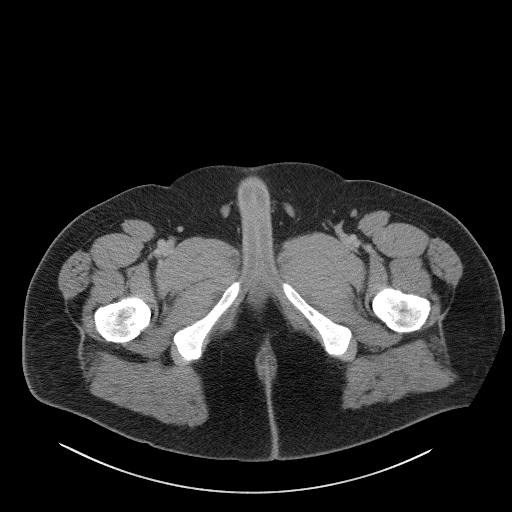
[im 5/108  bone]
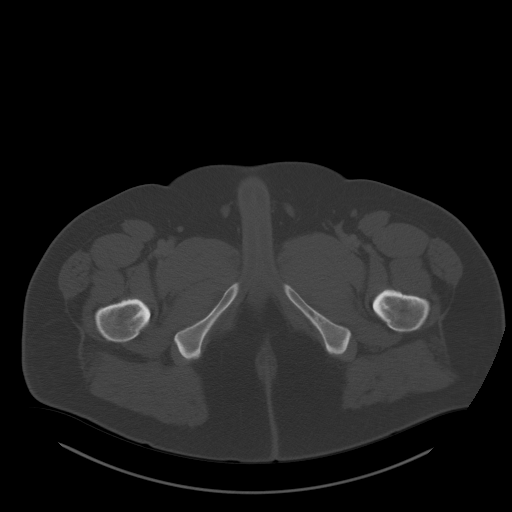
[im 15/108  soft-tissue]
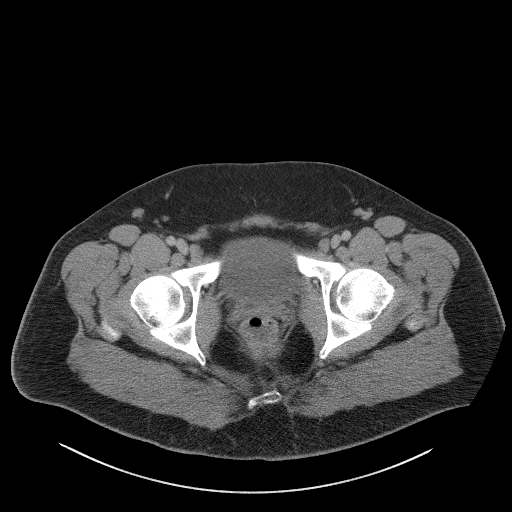
[im 25/108  soft-tissue]
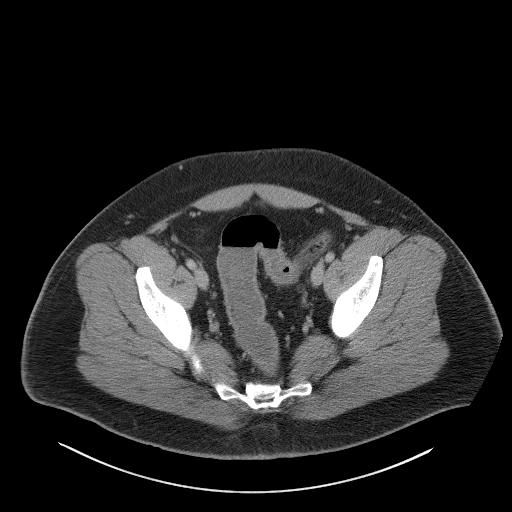
[im 35/108  soft-tissue]
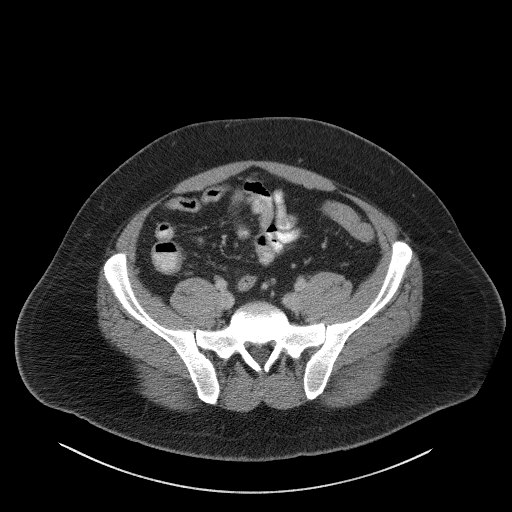
[im 39/108  soft-tissue]
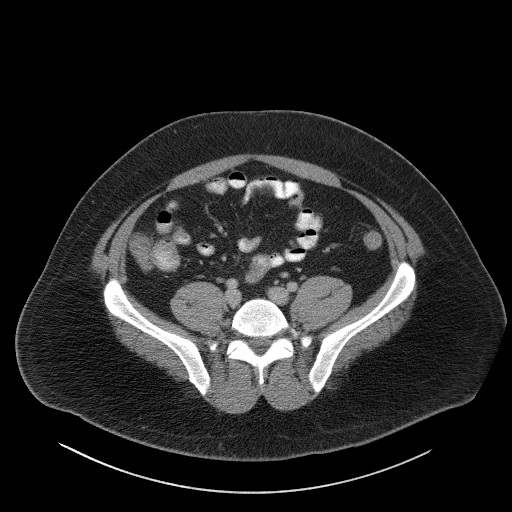
[im 49/108  soft-tissue]
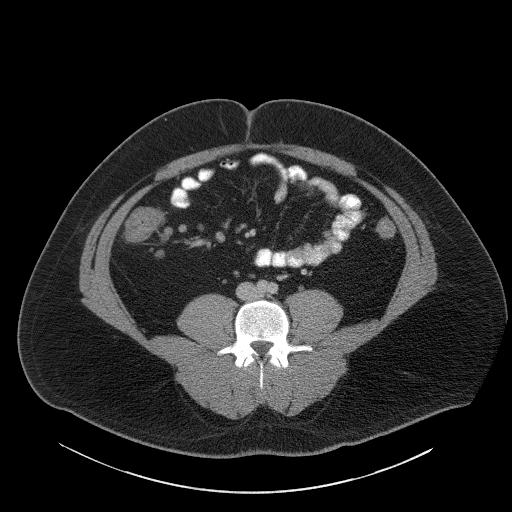
[im 59/108  soft-tissue]
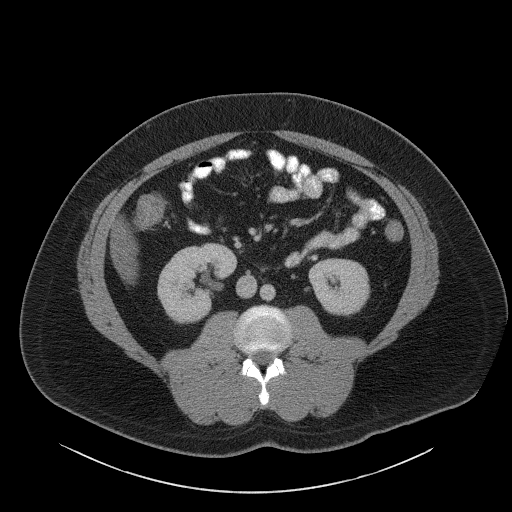
[im 69/108  soft-tissue]
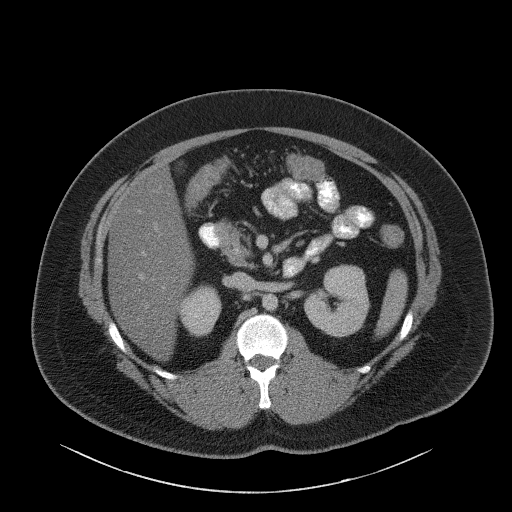
[im 73/108  soft-tissue]
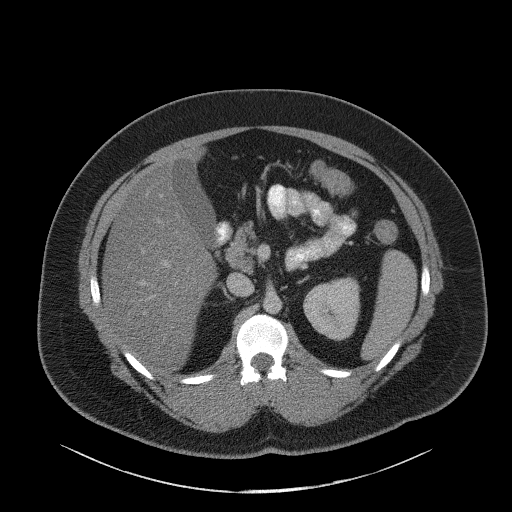
[im 73/108  bone]
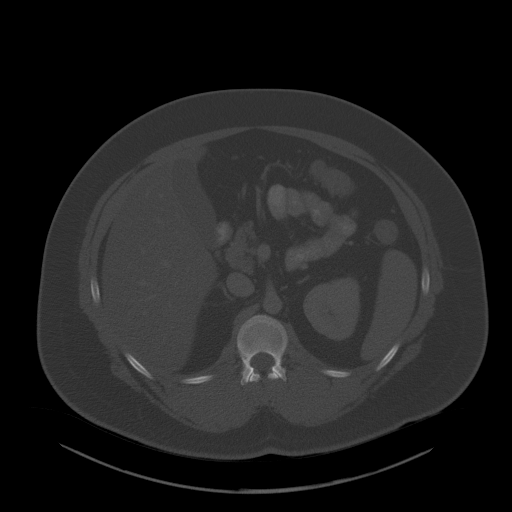
[im 83/108  soft-tissue]
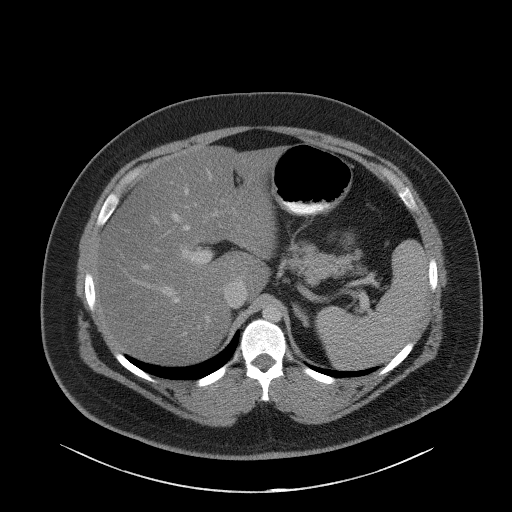
[im 93/108  soft-tissue]
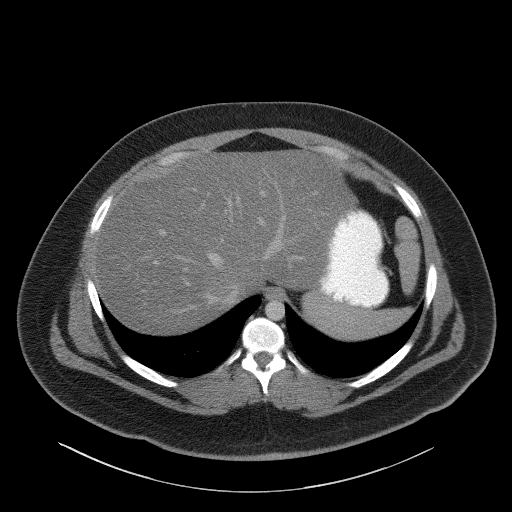
[im 103/108  soft-tissue]
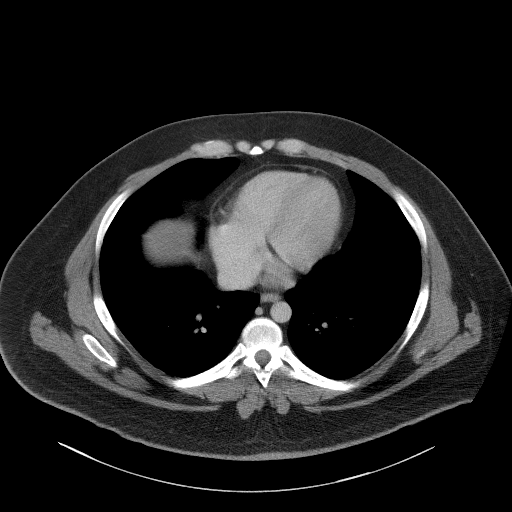

[Series 5: abd/pelvis 3.0 coronal · coronal · 0.82mm/px · 3 of 102 slices shown]
[im 34/102  soft-tissue]
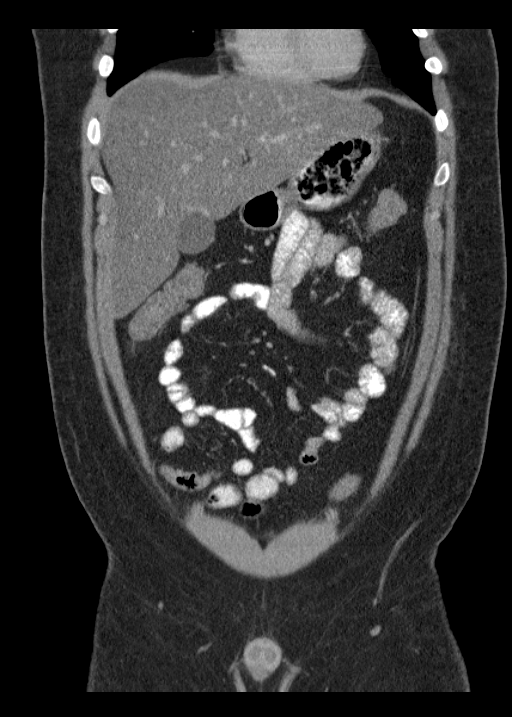
[im 45/102  soft-tissue]
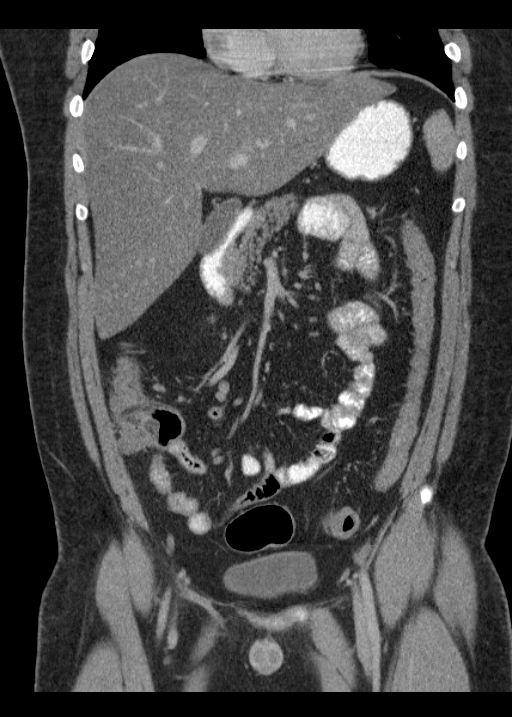
[im 57/102  soft-tissue]
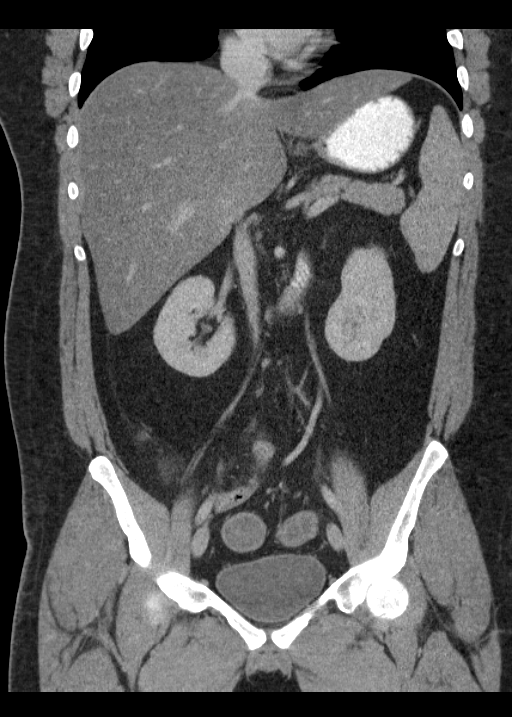

[15 of 46 positions shown; findings below may reference images not displayed]

FINDINGS: Lung bases are unremarkable. Sagittal images of the spine are
unremarkable. Fatty infiltration of the liver is noted. No focal
hepatic mass. No calcified gallstones are noted within gallbladder.
The pancreas, spleen and adrenal glands are unremarkable. Kidneys
are symmetrical in size and enhancement. No hydronephrosis or
hydroureter.

There is no small bowel obstruction. No ascites or free air. There
is nonspecific mild thickening of the colonic wall in right colon
transverse colon and descending colon. Small nonspecific lymph nodes
are noted in right lower quadrant mesentery the largest measures
cm. Some liquid stool noted in sigmoid colon suspicious for
diarrhea. Minimal enhancement of the mucosa sigmoid colon. Diffuse
mild colitis cannot be excluded. Clinical correlation is necessary.
There is no colonic obstruction. No pelvic ascites or adenopathy. No
inguinal adenopathy. No destructive bony lesions are noted within
pelvis.
IMPRESSION: 1. Fatty infiltration of the liver.
2. There is mild thickening of colonic wall in right colon
transverse colon and descending colon. Some liquid stool noted
within sigmoid colon suspicious for diarrhea. Diffuse mild colitis
cannot be excluded. Clinical correlation is necessary.
3. Small nonspecific lymph nodes are noted in right lower quadrant
mesentery the largest measures 1.1 cm borderline by size criteria,
this may be reactive.
4. No small bowel obstruction.
5. No hydronephrosis or hydroureter.
6. No ascites or free air.

## 2017-03-30 ENCOUNTER — Ambulatory Visit: Payer: 59 | Admitting: Physician Assistant

## 2017-03-30 ENCOUNTER — Encounter: Payer: Self-pay | Admitting: Physician Assistant

## 2017-03-30 ENCOUNTER — Other Ambulatory Visit: Payer: Self-pay

## 2017-03-30 VITALS — BP 112/84 | HR 94 | Temp 98.4°F | Resp 16 | Ht 71.0 in | Wt 328.0 lb

## 2017-03-30 DIAGNOSIS — R1013 Epigastric pain: Secondary | ICD-10-CM | POA: Diagnosis not present

## 2017-03-30 LAB — COMPREHENSIVE METABOLIC PANEL
ALK PHOS: 72 U/L (ref 39–117)
ALT: 82 U/L — AB (ref 0–53)
AST: 43 U/L — AB (ref 0–37)
Albumin: 4.4 g/dL (ref 3.5–5.2)
BILIRUBIN TOTAL: 0.9 mg/dL (ref 0.2–1.2)
BUN: 17 mg/dL (ref 6–23)
CALCIUM: 9.9 mg/dL (ref 8.4–10.5)
CO2: 27 meq/L (ref 19–32)
Chloride: 100 mEq/L (ref 96–112)
Creatinine, Ser: 0.91 mg/dL (ref 0.40–1.50)
GFR: 105.37 mL/min (ref >60.00)
GLUCOSE: 93 mg/dL (ref 70–99)
Potassium: 4.4 mEq/L (ref 3.5–5.1)
Sodium: 137 mEq/L (ref 135–145)
TOTAL PROTEIN: 8.1 g/dL (ref 6.0–8.3)

## 2017-03-30 LAB — LIPASE: Lipase: 12 U/L (ref 11.0–59.0)

## 2017-03-30 LAB — CBC WITH DIFFERENTIAL/PLATELET
BASOS ABS: 0 10*3/uL (ref 0.0–0.1)
Basophils Relative: 0.3 % (ref 0.0–3.0)
EOS ABS: 0.1 10*3/uL (ref 0.0–0.7)
Eosinophils Relative: 1.1 % (ref 0.0–5.0)
HEMATOCRIT: 48 % (ref 39.0–52.0)
Hemoglobin: 15.9 g/dL (ref 13.0–17.0)
LYMPHS PCT: 35.6 % (ref 12.0–46.0)
Lymphs Abs: 3 10*3/uL (ref 0.7–4.0)
MCHC: 33.2 g/dL (ref 30.0–36.0)
MCV: 83.4 fl (ref 78.0–100.0)
MONOS PCT: 9.3 % (ref 3.0–12.0)
Monocytes Absolute: 0.8 10*3/uL (ref 0.1–1.0)
NEUTROS ABS: 4.5 10*3/uL (ref 1.4–7.7)
Neutrophils Relative %: 53.7 % (ref 43.0–77.0)
PLATELETS: 305 10*3/uL (ref 150.0–400.0)
RBC: 5.75 Mil/uL (ref 4.22–5.81)
RDW: 15.2 % (ref 11.5–15.5)
WBC: 8.4 10*3/uL (ref 4.0–10.5)

## 2017-03-30 NOTE — Progress Notes (Signed)
Patient presents to clinic today c/o epigastric pain noted Sunday while drinking a soda. States pain was intense and associated with nausea. Noted some difficulty swallowing the drink. Since then has had a mild ache in the area along with intermittent nausea. Denies fever chills. Notes worsening symptoms after eating. Patient with history of GERD, currently on PPI and H2 blocker. Has GI appt pending for EGD to assess for ulceration. Denies any NSAID use or alcohol consumption.    Past Medical History:  Diagnosis Date  . Asthma   . Hemorrhoid 2017  . Hypertension   . Pilonidal cyst     Current Outpatient Medications on File Prior to Visit  Medication Sig Dispense Refill  . albuterol (PROVENTIL HFA;VENTOLIN HFA) 108 (90 Base) MCG/ACT inhaler Inhale 2 puffs into the lungs every 6 (six) hours as needed for wheezing or shortness of breath. 1 Inhaler 3  . Fexofenadine HCl (ALLEGRA PO) Take 1 tablet by mouth daily.    . pantoprazole (PROTONIX) 40 MG tablet TAKE 1 TABLET BY MOUTH  DAILY 90 tablet 1  . ranitidine (ZANTAC) 300 MG tablet Take 1 tablet (300 mg total) by mouth at bedtime. 30 tablet 6  . sucralfate (CARAFATE) 1 g tablet Take 1 tablet (1 g total) by mouth 4 (four) times daily -  with meals and at bedtime. 30 tablet 3   No current facility-administered medications on file prior to visit.     No Known Allergies  Family History  Problem Relation Age of Onset  . Diabetes Mother        mother's side of family  . Hypertension Mother        mother's side of family  . Diabetes Other        father's side of family, Paternal [place] aunt  . Colon cancer Paternal Grandmother     Social History   Socioeconomic History  . Marital status: Married    Spouse name: Not on file  . Number of children: 1  . Years of education: Not on file  . Highest education level: Not on file  Occupational History  . Occupation: Gaffer  Social Needs  . Financial resource strain: Not on  file  . Food insecurity:    Worry: Not on file    Inability: Not on file  . Transportation needs:    Medical: Not on file    Non-medical: Not on file  Tobacco Use  . Smoking status: Never Smoker  . Smokeless tobacco: Never Used  Substance and Sexual Activity  . Alcohol use: Yes    Comment: ocassionally  . Drug use: No  . Sexual activity: Yes  Lifestyle  . Physical activity:    Days per week: Not on file    Minutes per session: Not on file  . Stress: Not on file  Relationships  . Social connections:    Talks on phone: Not on file    Gets together: Not on file    Attends religious service: Not on file    Active member of club or organization: Not on file    Attends meetings of clubs or organizations: Not on file    Relationship status: Not on file  Other Topics Concern  . Not on file  Social History Narrative  . Not on file   Review of Systems - See HPI.  All other ROS are negative.  BP 112/84   Pulse 94   Temp 98.4 F (36.9 C) (Oral)   Resp 16  Ht '5\' 11"'  (1.803 m)   Wt (!) 328 lb (148.8 kg)   SpO2 99%   BMI 45.75 kg/m   Physical Exam  Constitutional: He is well-developed, well-nourished, and in no distress.  HENT:  Head: Normocephalic and atraumatic.  Eyes: Conjunctivae are normal.  Neck: Neck supple.  Cardiovascular: Normal rate, regular rhythm, normal heart sounds and intact distal pulses.  Pulmonary/Chest: Effort normal and breath sounds normal. No respiratory distress. He has no wheezes. He has no rales. He exhibits no tenderness.  Abdominal: Soft. Bowel sounds are normal. He exhibits no distension and no mass. There is tenderness in the epigastric area and left upper quadrant. There is no rebound and no guarding.  Vitals reviewed.  Recent Results (from the past 2160 hour(s))  POCT rapid strep A     Status: Abnormal   Collection Time: 01/08/17 10:18 AM  Result Value Ref Range   Rapid Strep A Screen Positive (A) Negative    Assessment/Plan: 1.  Epigastric pain In patient with history of significant GERD. Concern for Stricture. Has GI appointment pending. Will start Clear liquid diet. Resume Carafate and continue Protonix and Ranitidine. Labs as noted below. Will work on [place] appointment as EGD needed.  - CBC w/Diff - Comp Met (CMET) - Lipase   Leeanne Rio, PA-C

## 2017-03-30 NOTE — Patient Instructions (Signed)
Please go to the lab today for blood work.  I will call you with your results. We will alter treatment regimen(s) if indicated by your results.   Keep a clear liquid diet.  Avoid anti-inflammatories. Resume Carafate and Zantac. Continue Protonix.   We will alter regimen according to lab results and call Dr. Lamar SprinklesPerry's office to get you a more expedited appointment.    Clear Liquid Diet A clear liquid diet means that you only have liquids that you can see through. You do not eat any food on this diet. Most people need to follow this diet for only a short time. What do I need to know about this diet?  A clear liquid is a liquid that you can see through when you hold it up to a light.  This diet does not give you all the nutrients that you need. Choose a variety of the liquids that your doctor says you can drink on this diet. That way, you will get as many nutrients as possible.  If you are not sure whether you can have certain items, ask your doctor. What can I have?  Water and flavored water.  Fruit juices that do not have pulp, such as cranberry juice and apple juice.  Tea and coffee without milk or cream.  Clear bouillon or broth.  Broth-based soups that have been strained.  Flavored gelatins.  Honey.  Sugar water.  Frozen ice or frozen ice pops that do not have any milk, yogurt, fruit pieces, or fruit pulp in them.  Clear sodas.  Clear sports drinks. The items listed above may not be a complete list of recommended liquids. Contact your food and nutrition expert (dietitian) for more options. What can I not have?  Juices that have pulp.  Milk.  Cream or cream-based soups.  Yogurt. The items listed above may not be a complete list of liquids to avoid. Contact your food and nutrition expert for more information. Summary  A clear liquid diet is a diet that includes only liquids that you can see through.  The goal of this diet is to help you recover.  Make sure  to avoid liquids with milk, cream, or pulp while you are on this diet. This information is not intended to replace advice given to you by your health care provider. Make sure you discuss any questions you have with your health care provider. Document Released: 12/06/2007 Document Revised: 08/06/2015 Document Reviewed: 11/19/2012 Elsevier Interactive Patient Education  2017 [employer]

## 2017-03-31 ENCOUNTER — Other Ambulatory Visit: Payer: Self-pay | Admitting: Family Medicine

## 2017-04-08 ENCOUNTER — Other Ambulatory Visit: Payer: Self-pay

## 2017-04-08 ENCOUNTER — Ambulatory Visit (INDEPENDENT_AMBULATORY_CARE_PROVIDER_SITE_OTHER): Payer: 59 | Admitting: Family Medicine

## 2017-04-08 ENCOUNTER — Encounter: Payer: Self-pay | Admitting: Family Medicine

## 2017-04-08 VITALS — BP 130/90 | HR 103 | Temp 98.6°F | Resp 16 | Ht 71.0 in | Wt 332.4 lb

## 2017-04-08 DIAGNOSIS — Z0000 Encounter for general adult medical examination without abnormal findings: Secondary | ICD-10-CM

## 2017-04-08 NOTE — Patient Instructions (Signed)
Follow up in 1 year or as needed We'll notify you of your lab results and make any changes if needed Continue to work on healthy diet and regular exercise- you can do it! Call with any questions or concerns Congrats on the house!!!

## 2017-04-08 NOTE — Assessment & Plan Note (Signed)
Pt's PE WNL w/ exception of morbid obesity.  Stressed need for healthy diet and regular exercise.  UTD on Tdap.  Check labs.  Anticipatory guidance provided.

## 2017-04-08 NOTE — Assessment & Plan Note (Signed)
Ongoing issue for pt.  He is up 5 lbs since last visit.  Stressed need for healthy diet and regular exercise.  Check labs to risk stratify.  Will follow.

## 2017-04-08 NOTE — Progress Notes (Signed)
   Subjective:    Patient ID: Raymond Santana, male    DOB: 1990/07/12, 27 y.o.   MRN: [id number]  HPI CPE- UTD on Tdap.  No concerns.  BP is mildly elevated today- they just bought a new house and are starting renovations.   Review of Systems Patient reports no vision/hearing changes, anorexia, fever ,adenopathy, persistant/recurrent hoarseness, swallowing issues, chest pain, palpitations, edema, persistant/recurrent cough, hemoptysis, dyspnea (rest,exertional, paroxysmal nocturnal), gastrointestinal  bleeding (melena, rectal bleeding), abdominal pain, excessive heart burn, GU symptoms (dysuria, hematuria, voiding/incontinence issues) syncope, focal weakness, memory loss, numbness & tingling, skin/hair/nail changes, depression, anxiety, abnormal bruising/bleeding, musculoskeletal symptoms/signs.     Objective:   Physical Exam General Appearance:    Alert, cooperative, no distress, appears stated age, obese  Head:    Normocephalic, without obvious abnormality, atraumatic  Eyes:    PERRL, conjunctiva/corneas clear, EOM's intact, fundi    benign, both eyes       Ears:    Normal TM's and external ear canals, both ears  Nose:   Nares normal, septum midline, mucosa normal, no drainage   or sinus tenderness  Throat:   Lips, mucosa, and tongue normal; teeth and gums normal  Neck:   Supple, symmetrical, trachea midline, no adenopathy;       thyroid:  No enlargement/tenderness/nodules  Back:     Symmetric, no curvature, ROM normal, no CVA tenderness  Lungs:     Clear to auscultation bilaterally, respirations unlabored  Chest wall:    No tenderness or deformity  Heart:    Regular rate and rhythm, S1 and S2 normal, no murmur, rub   or gallop  Abdomen:     Soft, non-tender, bowel sounds active all four quadrants,    no masses, no organomegaly  Genitalia:    Normal male without lesion, masses,discharge or tenderness  Rectal:    Deferred due to young age  Extremities:   Extremities normal,  atraumatic, no cyanosis or edema  Pulses:   2+ and symmetric all extremities  Skin:   Skin color, texture, turgor normal, no rashes or lesions  Lymph nodes:   Cervical, supraclavicular, and axillary nodes normal  Neurologic:   CNII-XII intact. Normal strength, sensation and reflexes      throughout          Assessment & Plan:

## 2017-04-09 ENCOUNTER — Other Ambulatory Visit: Payer: Self-pay | Admitting: General Practice

## 2017-04-09 LAB — CBC WITH DIFFERENTIAL/PLATELET
Basophils Absolute: 41 cells/uL (ref 0–200)
Basophils Relative: 0.4 %
EOS ABS: 143 {cells}/uL (ref 15–500)
EOS PCT: 1.4 %
HCT: 45.1 % (ref 38.5–50.0)
HEMOGLOBIN: 15.9 g/dL (ref 13.2–17.1)
Lymphs Abs: 4080 cells/uL — ABNORMAL HIGH (ref 850–3900)
MCH: 28.1 pg (ref 27.0–33.0)
MCHC: 35.3 g/dL (ref 32.0–36.0)
MCV: 79.8 fL — ABNORMAL LOW (ref 80.0–100.0)
MONOS PCT: 8.8 %
MPV: 10.1 fL (ref 7.5–12.5)
Neutro Abs: 5039 cells/uL (ref 1500–7800)
Neutrophils Relative %: 49.4 %
PLATELETS: 354 10*3/uL (ref 140–400)
RBC: 5.65 10*6/uL (ref 4.20–5.80)
RDW: 14.1 % (ref 11.0–15.0)
TOTAL LYMPHOCYTE: 40 %
WBC mixed population: 898 cells/uL (ref 200–950)
WBC: 10.2 10*3/uL (ref 3.8–10.8)

## 2017-04-09 LAB — BASIC METABOLIC PANEL
BUN: 11 mg/dL (ref 7–25)
CALCIUM: 9.4 mg/dL (ref 8.6–10.3)
CHLORIDE: 108 mmol/L (ref 98–110)
CO2: 23 mmol/L (ref 20–32)
Creat: 0.92 mg/dL (ref 0.60–1.35)
Glucose, Bld: 94 mg/dL (ref 65–99)
POTASSIUM: 4 mmol/L (ref 3.5–5.3)
SODIUM: 141 mmol/L (ref 135–146)

## 2017-04-09 LAB — HEMOGLOBIN A1C
EAG (MMOL/L): 7 (calc)
HEMOGLOBIN A1C: 6 %{Hb} — AB (ref <5.7)
MEAN PLASMA GLUCOSE: 126 (calc)

## 2017-04-09 LAB — HEPATIC FUNCTION PANEL
AG RATIO: 1.6 (calc) (ref 1.0–2.5)
ALBUMIN MSPROF: 4.5 g/dL (ref 3.6–5.1)
ALT: 65 U/L — AB (ref 9–46)
AST: 33 U/L (ref 10–40)
Alkaline phosphatase (APISO): 79 U/L (ref 40–115)
Bilirubin, Direct: 0.1 mg/dL (ref 0.0–0.2)
Globulin: 2.9 g/dL (calc) (ref 1.9–3.7)
Indirect Bilirubin: 0.3 mg/dL (calc) (ref 0.2–1.2)
TOTAL PROTEIN: 7.4 g/dL (ref 6.1–8.1)
Total Bilirubin: 0.4 mg/dL (ref 0.2–1.2)

## 2017-04-09 LAB — LIPID PANEL
CHOL/HDL RATIO: 5.8 (calc) — AB (ref <5.0)
CHOLESTEROL: 196 mg/dL (ref <200)
HDL: 34 mg/dL — AB (ref >40)
LDL CHOLESTEROL (CALC): 107 mg/dL — AB
Non-HDL Cholesterol (Calc): 162 mg/dL (calc) — ABNORMAL HIGH (ref <130)
Triglycerides: 387 mg/dL — ABNORMAL HIGH (ref <150)

## 2017-04-09 LAB — TSH: TSH: 1.71 m[IU]/L (ref 0.40–4.50)

## 2017-04-09 MED ORDER — FENOFIBRATE 160 MG PO TABS: 160.0000 mg | ORAL_TABLET | Freq: Every day | ORAL | 1 refills | Status: DC

## 2017-04-15 ENCOUNTER — Encounter: Payer: Self-pay | Admitting: Physician Assistant

## 2017-04-15 ENCOUNTER — Ambulatory Visit: Payer: 59 | Admitting: Physician Assistant

## 2017-04-15 VITALS — BP 126/72 | HR 80 | Ht 72.0 in | Wt 333.0 lb

## 2017-04-15 DIAGNOSIS — R1013 Epigastric pain: Secondary | ICD-10-CM | POA: Diagnosis not present

## 2017-04-15 DIAGNOSIS — R7401 Elevation of levels of liver transaminase levels: Secondary | ICD-10-CM

## 2017-04-15 DIAGNOSIS — R740 Nonspecific elevation of levels of transaminase and lactic acid dehydrogenase [LDH]: Secondary | ICD-10-CM

## 2017-04-15 DIAGNOSIS — Z8379 Family history of other diseases of the digestive system: Secondary | ICD-10-CM

## 2017-04-15 DIAGNOSIS — K219 Gastro-esophageal reflux disease without esophagitis: Secondary | ICD-10-CM | POA: Diagnosis not present

## 2017-04-15 DIAGNOSIS — K760 Fatty (change of) liver, not elsewhere classified: Secondary | ICD-10-CM | POA: Diagnosis not present

## 2017-04-15 NOTE — Progress Notes (Signed)
Assessment and plan reviewed. This patient does need further lab testing regarding chronic elevation of ALT to rule out entities other than fatty liver

## 2017-04-15 NOTE — Patient Instructions (Signed)
If you are age 28 or older, your body mass index should be between 23-30. Your Body mass index is 45.16 kg/m. If this is out of the aforementioned range listed, please consider follow up with your Primary Care Provider.  If you are age 28 or younger, your body mass index should be between 19-25. Your Body mass index is 45.16 kg/m. If this is out of the aformentioned range listed, please consider follow up with your Primary Care Provider.   You have been scheduled for an endoscopy. Please follow written instructions given to you at your visit today. If you use inhalers (even only as needed), please bring them with you on the day of your procedure. Your physician has requested that you go to www.startemmi.com and enter the access code given to you at your visit today. This web site gives a general overview about your procedure. However, you should still follow specific instructions given to you by our office regarding your preparation for the procedure.  You have been scheduled [facility] abdominal [facility] of hospital) on 04/20/17 at 8:30 am. Please arrive 15 minutes prior to your appointment for registration. Make certain not to have anything to eat or drink 6 after midnight the day before prior to your appointment. Should you need to reschedule your appointment, please contact radiology at [phone]. This test typically takes about 30 minutes to perform.  Recommend slow and steady weight loss of 1-2 lbs per week.  Thank you for choosing me and [facility].   Hyacinth MeekerJennifer Lemmon, PA-C

## 2017-04-15 NOTE — Progress Notes (Signed)
 Chief Complaint: Elevated ALT  HPI:    Mr. Raymond Santana is a 27-year-old male with a past medical history as listed below, who follows with Dr. Perry for his GERD, who was referred to me by Tabori, Katherine E, MD for a complaint of elevated ALT.      Most recent labs 04/08/17 show an ALT of 65, compared to 82 two weeks prior, 73 five months ago and 68-year ago.  All other LFTs are normal.  CBC normal.  Lipid panel with elevated triglycerides and LDL.  CT 07/11/14 shows fatty infiltration of the liver.    Office visit 12/23/16 Dr. Perry discussion of GERD, continued on Pantoprazole 40 mg daily and scheduled for an EGD to evaluate chronic reflux as well as mild dysphagia and screen for Barrett's given his family history.    Today, explains that since being seen, he did have a severe episode of epigastric pain after drinking a very cold drink coming in from working outside, this was so intense that it doubled him over.  After this for a few weeks anytime he would eat or drink he did have some discomfort, this has since gotten a little better.  Patient tells me he rescheduled his EGD for May as they were buying a house and they could not incur any bills.    Sent here today for elevated liver enzyme.  Is aware of history of fatty liver.  Describes drinking 1-2 alcoholic beverages every couple of months at family occasions/parties.  Describes using an over-the-counter supplement for "brain health", apparently he has been doing this for the past 2 years.  Denies previous history of alcohol usage.  Denies family history of liver disease, knowledge of hepatitis diagnosis, previous blood transfusions, IV drug use or other risk factors.    Denies fever, chills, blood in stool, melena, weight loss, anorexia, nausea, vomiting or symptoms that awaken him at night.  Past Medical History:  Diagnosis Date  . Asthma   . Hemorrhoid 2017  . Hypertension   . Pilonidal cyst     Past Surgical History:  Procedure Laterality  Date  . APPENDECTOMY    . CYSTECTOMY     Buttock  . Staph infection     L toe  . WISDOM TOOTH EXTRACTION Bilateral     Current Outpatient Medications  Medication Sig Dispense Refill  . albuterol (PROVENTIL HFA;VENTOLIN HFA) 108 (90 Base) MCG/ACT inhaler INHALE TWO PUFFS BY MOUTH EVERY 6 HOURS AS NEEDED FOR  WHEEZING  OR  SHORTNESS  OF  BREATH 18 each 3  . Fexofenadine HCl (ALLEGRA PO) Take 1 tablet by mouth daily.    . pantoprazole (PROTONIX) 40 MG tablet TAKE 1 TABLET BY MOUTH  DAILY 90 tablet 1  . ranitidine (ZANTAC) 300 MG tablet Take 1 tablet (300 mg total) by mouth at bedtime. 30 tablet 6   No current facility-administered medications for this visit.     Allergies as of 04/15/2017  . (No Known Allergies)    Family History  Problem Relation Age of Onset  . Diabetes Mother        mother's side of family  . Hypertension Mother        mother's side of family  . Barrett's esophagus Father   . Diabetes Other        father's side of family, Paternal Great aunt  . Colon cancer Paternal Grandmother     Social History   Socioeconomic History  . Marital status: Married      Spouse name: Not on file  . Number of children: 1  . Years of education: Not on file  . Highest education level: Not on file  Occupational History  . Occupation: Gaffer  Social Needs  . Financial resource strain: Not on file  . Food insecurity:    Worry: Not on file    Inability: Not on file  . Transportation needs:    Medical: Not on file    Non-medical: Not on file  Tobacco Use  . Smoking status: Never Smoker  . Smokeless tobacco: Never Used  Substance and Sexual Activity  . Alcohol use: Yes    Comment: ocassionally  . Drug use: No  . Sexual activity: Yes  Lifestyle  . Physical activity:    Days per week: Not on file    Minutes per session: Not on file  . Stress: Not on file  Relationships  . Social connections:    Talks on phone: Not on file    Gets together: Not on file      Attends religious service: Not on file    Active member of club or organization: Not on file    Attends meetings of clubs or organizations: Not on file    Relationship status: Not on file  . Intimate partner violence:    Fear of current or ex partner: Not on file    Emotionally abused: Not on file    Physically abused: Not on file    Forced sexual activity: Not on file  Other Topics Concern  . Not on file  Social History Narrative  . Not on file    Review of Systems:    Constitutional: No weight loss, fever or chills Skin: No rash  Cardiovascular: No chest pain  Respiratory: No SOB Gastrointestinal: See HPI and otherwise negative   Physical Exam:  Vital signs: BP 126/72   Pulse 80   Ht 6' (1.829 m)   Wt (!) 333 lb (151 kg)   BMI 45.16 kg/m   Constitutional:   Pleasant obese Caucasian male appears to be in NAD, Well developed, Well nourished, alert and cooperative Respiratory: Respirations even and unlabored. Lungs clear to auscultation bilaterally.   No wheezes, crackles, or rhonchi.  Cardiovascular: Normal S1, S2. No MRG. Regular rate and rhythm. No peripheral edema, cyanosis or pallor.  Gastrointestinal:  Soft, nondistended, nontender. No rebound or guarding. Normal bowel sounds. No appreciable masses or hepatomegaly. limited due to body habitus Psychiatric:  Demonstrates good judgement and reason without abnormal affect or behaviors.  RELEVANT LABS AND IMAGING: CBC    Component Value Date/Time   WBC 10.2 04/08/2017 1611   RBC 5.65 04/08/2017 1611   HGB 15.9 04/08/2017 1611   HCT 45.1 04/08/2017 1611   PLT 354 04/08/2017 1611   MCV 79.8 (L) 04/08/2017 1611   MCH 28.1 04/08/2017 1611   MCHC 35.3 04/08/2017 1611   RDW 14.1 04/08/2017 1611   LYMPHSABS 4,080 (H) 04/08/2017 1611   MONOABS 0.8 03/30/2017 1006   EOSABS 143 04/08/2017 1611   BASOSABS 41 04/08/2017 1611    CMP     Component Value Date/Time   NA 141 04/08/2017 1611   K 4.0 04/08/2017 1611   CL  108 04/08/2017 1611   CO2 23 04/08/2017 1611   GLUCOSE 94 04/08/2017 1611   BUN 11 04/08/2017 1611   CREATININE 0.92 04/08/2017 1611   CALCIUM 9.4 04/08/2017 1611   PROT 7.4 04/08/2017 1611   ALBUMIN 4.4 03/30/2017 1006  AST 33 04/08/2017 1611   ALT 65 (H) 04/08/2017 1611   ALKPHOS 72 03/30/2017 1006   BILITOT 0.4 04/08/2017 1611   GFRNONAA >60 07/11/2014 1425   GFRAA >60 07/11/2014 1425   Hepatic Function Latest Ref Rng & Units 04/08/2017 03/30/2017 10/28/2016  Total Protein 6.1 - 8.1 g/dL 7.4 8.1 7.7  Albumin 3.5 - 5.2 g/dL - 4.4 4.7  AST 10 - 40 U/L 33 43(H) 32  ALT 9 - 46 U/L 65(H) 82(H) 73(H)  Alk Phosphatase 39 - 117 U/L - 72 85  Total Bilirubin 0.2 - 1.2 mg/dL 0.4 0.9 0.6  Bilirubin, Direct 0.0 - 0.2 mg/dL 0.1 - -   Assessment: 1.  Elevated ALT: Minimally elevated since 2016, most recently 65 on 04/08/17, although liver enzymes normal, fatty liver via CT in 2016, this is a most likely cause 2.  Fatty liver: Via CT 2016 3.  Epigastric pain/GERD: Continues per patient, though somewhat improved over the past few months, already arranged for EGD 4.  Family history of Barrett's esophagus: in father  Plan: 1.  Continue Pantoprazole as prescribed for now. 2.  Continue with EGD scheduled with Dr. Perry on May 13.  Did go ahead and go over instructions today and canceled patient's previsit.  Did review risks, benefits, limitations and alternatives and the patient agrees to proceed. 3.  Ordered right upper quadrant ultrasound for further evaluation of elevated ALT. 4.  Discussed that ALT is minimally elevated and most likely this is due to fatty liver infiltration.  Discussed this in detail.  Recommended a slow and steady weight loss of 1-2 pounds per week.  Explained that Dr. Perry could review ultrasound at time of EGD and decide if patient needed further lab testing. 5.  Patient advised to consider stopping his over-the-counter supplement as this may also be affecting his liver. 6.   Patient to follow in clinic per recommendations from Dr. Perry after time of procedure.  Jennifer Lemmon, PA-C [facility] 04/15/2017, 8:45 AM  Cc: Tabori, Katherine E, MD 

## 2017-04-16 ENCOUNTER — Telehealth: Payer: Self-pay

## 2017-04-16 DIAGNOSIS — R740 Nonspecific elevation of levels of transaminase and lactic acid dehydrogenase [LDH]: Principal | ICD-10-CM

## 2017-04-16 DIAGNOSIS — R7401 Elevation of levels of liver transaminase levels: Secondary | ICD-10-CM

## 2017-04-16 NOTE — Telephone Encounter (Signed)
Lab order in Epic pt called to advise to come in at his convenience to have drawn.  Left message on machine to call back

## 2017-04-16 NOTE — Progress Notes (Signed)
Dr. Marina GoodellPerry would like further labs/full workup for elevated ALT. Please order labs including alpha 1 antitrypsin, AMA, ASMA, ANA, Ceruloplasmin, Iron stuide +ferritin, PT/INR and hepatitis panel. Thanks-JLL

## 2017-04-16 NOTE — Telephone Encounter (Signed)
The patient has been notified of this information and all questions answered.

## 2017-04-16 NOTE — Telephone Encounter (Signed)
Author: Unk LightningLemmon, Jennifer Lynne, PA Service: Gastroenterology Author Type: Physician Assistant  Filed: 04/16/2017 9:48 AM Encounter Date: 04/15/2017 Status: Signed  Editor: Unk LightningLemmon, Jennifer Lynne, PA (Physician Assistant)       [] Hide copied text  [] Hover for details   Dr. Marina GoodellPerry would like further labs/full workup for elevated ALT. Please order labs including alpha 1 antitrypsin, AMA, ASMA, ANA, Ceruloplasmin, Iron stuide +ferritin, PT/INR and hepatitis panel. Thanks-JLL

## 2017-04-20 ENCOUNTER — Other Ambulatory Visit (INDEPENDENT_AMBULATORY_CARE_PROVIDER_SITE_OTHER): Payer: 59

## 2017-04-20 ENCOUNTER — Ambulatory Visit (HOSPITAL_COMMUNITY)
Admission: RE | Admit: 2017-04-20 | Discharge: 2017-04-20 | Disposition: A | Discharge status: Home / Self Care | Payer: 59 | Source: Ambulatory Visit | Attending: Physician Assistant | Admitting: Physician Assistant

## 2017-04-20 DIAGNOSIS — R7401 Elevation of levels of liver transaminase levels: Secondary | ICD-10-CM

## 2017-04-20 DIAGNOSIS — K760 Fatty (change of) liver, not elsewhere classified: Secondary | ICD-10-CM | POA: Insufficient documentation

## 2017-04-20 DIAGNOSIS — R740 Nonspecific elevation of levels of transaminase and lactic acid dehydrogenase [LDH]: Secondary | ICD-10-CM | POA: Insufficient documentation

## 2017-04-20 DIAGNOSIS — R1013 Epigastric pain: Secondary | ICD-10-CM | POA: Diagnosis present

## 2017-04-20 LAB — IBC PANEL
Iron: 79 ug/dL (ref 42–165)
Saturation Ratios: 22.5 % (ref 20.0–50.0)
TRANSFERRIN: 251 mg/dL (ref 212.0–360.0)

## 2017-04-20 LAB — HEPATIC FUNCTION PANEL
ALT: 66 U/L — AB (ref 0–53)
AST: 39 U/L — ABNORMAL HIGH (ref 0–37)
Albumin: 4.2 g/dL (ref 3.5–5.2)
Alkaline Phosphatase: 70 U/L (ref 39–117)
BILIRUBIN DIRECT: 0.1 mg/dL (ref 0.0–0.3)
BILIRUBIN TOTAL: 0.5 mg/dL (ref 0.2–1.2)
Total Protein: 7.5 g/dL (ref 6.0–8.3)

## 2017-04-20 LAB — FERRITIN: FERRITIN: 70.4 ng/mL (ref 22.0–322.0)

## 2017-04-20 LAB — PROTIME-INR
INR: 1 ratio (ref 0.8–1.0)
PROTHROMBIN TIME: 11.2 s (ref 9.6–13.1)

## 2017-04-24 LAB — ANA: Anti Nuclear Antibody(ANA): NEGATIVE

## 2017-04-24 LAB — MITOCHONDRIAL ANTIBODIES: Mitochondrial M2 Ab, IgG: <=20 U

## 2017-04-24 LAB — CERULOPLASMIN: Ceruloplasmin: 29 mg/dL (ref 18–36)

## 2017-04-24 LAB — ANTI-SMOOTH MUSCLE ANTIBODY, IGG: Actin (Smooth Muscle) Antibody (IGG): <20 U (ref <20)

## 2017-04-24 LAB — ALPHA-1-ANTITRYPSIN: A1 ANTITRYPSIN SER: 139 mg/dL (ref 83–199)

## 2017-05-04 ENCOUNTER — Encounter: Payer: Self-pay | Admitting: Internal Medicine

## 2017-05-05 ENCOUNTER — Other Ambulatory Visit: Payer: Self-pay | Admitting: Family Medicine

## 2017-05-13 ENCOUNTER — Other Ambulatory Visit: Payer: Self-pay | Admitting: Family Medicine

## 2017-05-13 MED ORDER — ALBUTEROL SULFATE HFA 108 (90 BASE) MCG/ACT IN AERS: INHALATION_SPRAY | RESPIRATORY_TRACT | 3 refills | Status: DC

## 2017-05-13 NOTE — Telephone Encounter (Signed)
Copied from CRM [id number]. Topic: Quick Communication - Rx Refill/Question >> May 13, 2017  9:20 AM Arlyss Gandy, NT wrote: Medication: albuterol (PROVENTIL HFA;VENTOLIN HFA) 108 (90 Base) MCG/ACT inhaler Has the patient contacted their pharmacy? Yes.   (Agent: If no, request that the patient contact the pharmacy for the refill.) Preferred Pharmacy (with phone number or street name): [id number] [place], [place] - [address] [phone] (Phone) [phone] (Fax)     Agent: Please be advised that RX refills may take up to 3 business days. We ask that you follow-up with your pharmacy.

## 2017-05-18 ENCOUNTER — Encounter: Payer: Self-pay | Admitting: Internal Medicine

## 2017-05-18 ENCOUNTER — Ambulatory Visit (AMBULATORY_SURGERY_CENTER): Payer: 59 | Admitting: Internal Medicine

## 2017-05-18 VITALS — BP 110/78 | HR 79 | Temp 99.3°F | Resp 19 | Ht 72.0 in | Wt 333.0 lb

## 2017-05-18 DIAGNOSIS — K219 Gastro-esophageal reflux disease without esophagitis: Secondary | ICD-10-CM

## 2017-05-18 DIAGNOSIS — R1013 Epigastric pain: Secondary | ICD-10-CM | POA: Diagnosis present

## 2017-05-18 DIAGNOSIS — R1310 Dysphagia, unspecified: Secondary | ICD-10-CM | POA: Diagnosis not present

## 2017-05-18 DIAGNOSIS — Z8379 Family history of other diseases of the digestive system: Secondary | ICD-10-CM

## 2017-05-18 DIAGNOSIS — R1319 Other dysphagia: Secondary | ICD-10-CM

## 2017-05-18 DIAGNOSIS — J45909 Unspecified asthma, uncomplicated: Secondary | ICD-10-CM | POA: Diagnosis not present

## 2017-05-18 MED ORDER — SODIUM CHLORIDE 0.9 % IV SOLN: 500.0000 mL | Freq: Once | INTRAVENOUS | Status: DC

## 2017-05-18 NOTE — Patient Instructions (Signed)
   Thank you for allowing [place] to care for you today!!     YOU HAD AN ENDOSCOPIC PROCEDURE TODAY AT THE [facility]:   Refer to the procedure report that was given to you for any specific questions about what was found during the examination.  If the procedure report does not answer your questions, please call your gastroenterologist to clarify.  If you requested that your care partner not be given the details of your procedure findings, then the procedure report has been included in a sealed envelope for you to review at your convenience later.  YOU SHOULD EXPECT: Some feelings of bloating in the abdomen. Passage of more gas than usual.  Walking can help get rid of the air that was put into your GI tract during the procedure and reduce the bloating. If you had a lower endoscopy (such as a colonoscopy or flexible sigmoidoscopy) you may notice spotting of blood in your stool or on the toilet paper. If you underwent a bowel prep for your procedure, you may not have a normal bowel movement for a few days.  Please Note:  You might notice some irritation and congestion in your nose or some drainage.  This is from the oxygen used during your procedure.  There is no need for concern and it should clear up in a day or so.  SYMPTOMS TO REPORT IMMEDIATELY:     Following upper endoscopy (EGD)  Vomiting of blood or coffee ground material  New chest pain or pain under the shoulder blades  Painful or persistently difficult swallowing  New shortness of breath  Fever of 100F or higher  Black, tarry-looking stools  For urgent or emergent issues, a gastroenterologist can be reached at any hour by calling [phone].   DIET:  We do recommend a small meal at first, but then you may proceed to your regular diet.  Drink plenty of fluids but you should avoid alcoholic beverages for 24 hours.  ACTIVITY:  You should plan to take it easy for the rest of today and you should NOT DRIVE or use heavy  machinery until tomorrow (because of the sedation medicines used during the test).    FOLLOW UP: Our staff will call the number listed on your records the next business day following your procedure to check on you and address any questions or concerns that you may have regarding the information given to you following your procedure. If we do not reach you, we will leave a message.  However, if you are feeling well and you are not experiencing any problems, there is no need to return our call.  We will assume that you have returned to your regular daily activities without incident.  If any biopsies were taken you will be contacted by phone or by letter within the next 1-3 weeks.  Please call us at [phone] if you have not heard about the biopsies in 3 weeks.    SIGNATURES/CONFIDENTIALITY: You and/or your care partner have signed paperwork which will be entered into your electronic medical record.  These signatures attest to the fact that that the information above on your After Visit Summary has been reviewed and is understood.  Full responsibility of the confidentiality of this discharge information lies with you and/or your care-partner.

## 2017-05-18 NOTE — Progress Notes (Signed)
Report to PACU, RN, vss, BBS= Clear.  

## 2017-05-18 NOTE — Progress Notes (Signed)
Pt's states no medical or surgical changes since previsit or office visit. 

## 2017-05-18 NOTE — Op Note (Signed)
[facility] Patient Name: Raymond Santana Procedure Date: 05/18/2017 10:12 AM MRN: [id number] Endoscopist: Wilhemina Bonito. Marina Goodell , MD Age: 27 Referring MD:  Date of Birth: 20-Mar-1990 Gender: Male Account #: [id number] Procedure:                Upper GI endoscopy Indications:              Esophageal reflux. Mild dysphagia. Family history                            of Barrett's Medicines:                Monitored Anesthesia Care Procedure:                Pre-Anesthesia Assessment:                           - Prior to the procedure, a History and Physical                            was performed, and patient medications and                            allergies were reviewed. The patient's tolerance of                            previous anesthesia was also reviewed. The risks                            and benefits of the procedure and the sedation                            options and risks were discussed with the patient.                            All questions were answered, and informed consent                            was obtained. Prior Anticoagulants: The patient has                            taken no previous anticoagulant or antiplatelet                            agents. ASA Grade Assessment: II - A patient with                            mild systemic disease. After reviewing the risks                            and benefits, the patient was deemed in                            satisfactory condition to undergo the procedure.  After obtaining informed consent, the endoscope was                            passed under direct vision. Throughout the                            procedure, the patient's blood pressure, pulse, and                            oxygen saturations were monitored continuously. The                            Endoscope was introduced through the mouth, and                            advanced to the second part of duodenum.  The upper                            GI endoscopy was accomplished without difficulty.                            The patient tolerated the procedure well. Scope In: Scope Out: Findings:                 The esophagus was normal. No Barrett's, stricture,                            or inflammation.                           The stomach was normal.                           The examined duodenum was normal.                           The cardia and gastric fundus were normal on                            retroflexion. Complications:            No immediate complications. Estimated Blood Loss:     Estimated blood loss: none. Impression:               - Normal esophagus.                           - Normal stomach.                           - Normal examined duodenum.                           - No specimens collected. Recommendation:           - Patient has a contact number available for  emergencies. The signs and symptoms of potential                            delayed complications were discussed with the                            patient. Return to normal activities tomorrow.                            Written discharge instructions were provided to the                            patient.                           - Reflux precautions with attention to weight loss.                           - Continue present medications.                           - Return to the care of your primary provider Wilhemina Bonito. Marina Goodell, MD 05/18/2017 10:26:15 AM This report has been signed electronically.

## 2017-05-19 ENCOUNTER — Telehealth: Payer: Self-pay | Admitting: *Deleted

## 2017-05-19 NOTE — Telephone Encounter (Signed)
  Follow up Call-  Call back number 05/18/2017  Post procedure Call Back phone  # [phone]  Permission to leave phone message Yes  Some recent data might be hidden     Patient questions:  Do you have a fever, pain , or abdominal swelling? No. Pain Score  0 *  Have you tolerated food without any problems? Yes.    Have you been able to return to your normal activities? Yes.    Do you have any questions about your discharge instructions: Diet   No. Medications  No. Follow up visit  No.  Do you have questions or concerns about your Care? No.  Actions: * If pain score is 4 or above: No action needed, pain <4.

## 2017-08-12 ENCOUNTER — Other Ambulatory Visit: Payer: Self-pay | Admitting: Family Medicine

## 2017-08-13 ENCOUNTER — Other Ambulatory Visit: Payer: Self-pay

## 2017-08-13 MED ORDER — RANITIDINE HCL 300 MG PO TABS: 300.0000 mg | ORAL_TABLET | Freq: Every day | ORAL | 6 refills | Status: DC

## 2017-08-21 ENCOUNTER — Other Ambulatory Visit: Payer: Self-pay | Admitting: Emergency Medicine

## 2017-08-21 ENCOUNTER — Telehealth: Payer: Self-pay | Admitting: Family Medicine

## 2017-08-21 DIAGNOSIS — E781 Pure hyperglyceridemia: Secondary | ICD-10-CM

## 2017-08-21 MED ORDER — FENOFIBRATE 160 MG PO TABS: 160.0000 mg | ORAL_TABLET | Freq: Every day | ORAL | 1 refills | Status: DC

## 2017-08-21 NOTE — Telephone Encounter (Signed)
Copied from CRM [id number]. Topic: Quick Communication - Rx Refill/Question >> Aug 21, 2017  8:16 AM Maia Pettiesrtiz, Kristie S wrote: Medication: fenofibrate 160 MG tablet - pt is wanting to get this ordered thru [facility] mail order in 90 day supply This medication is on the discontinued list but unsure why. Wife would like someone to notify pt if there is an issue or if it was to be d/c.   Has the patient contacted their pharmacy? Yes - they have requested but no response wife thinks Preferred Pharmacy (with phone number or street name): [facility] [place] - [id number] [address] 538 [phone] (Phone) [phone] (Fax)

## 2017-08-21 NOTE — Telephone Encounter (Signed)
Looks like Fenofibrate was D/C by error. Patient is currently taking medication and will refill to the mail order pharmacy [facility]

## 2017-09-02 ENCOUNTER — Encounter: Payer: Self-pay | Admitting: Family Medicine

## 2017-09-02 ENCOUNTER — Ambulatory Visit: Payer: 59 | Admitting: Family Medicine

## 2017-09-02 ENCOUNTER — Other Ambulatory Visit: Payer: Self-pay

## 2017-09-02 VITALS — BP 118/80 | HR 105 | Temp 98.6°F | Resp 16 | Ht 72.0 in | Wt 347.5 lb

## 2017-09-02 DIAGNOSIS — J029 Acute pharyngitis, unspecified: Secondary | ICD-10-CM | POA: Diagnosis not present

## 2017-09-02 DIAGNOSIS — J329 Chronic sinusitis, unspecified: Secondary | ICD-10-CM

## 2017-09-02 DIAGNOSIS — B9689 Other specified bacterial agents as the cause of diseases classified elsewhere: Secondary | ICD-10-CM | POA: Diagnosis not present

## 2017-09-02 LAB — POCT RAPID STREP A (OFFICE): Rapid Strep A Screen: NEGATIVE

## 2017-09-02 MED ORDER — AMOXICILLIN 875 MG PO TABS: 875.0000 mg | ORAL_TABLET | Freq: Two times a day (BID) | ORAL | 0 refills | Status: DC

## 2017-09-02 NOTE — Patient Instructions (Signed)
Follow up as needed or as scheduled START the Amoxicillin twice daily for your sinus infection Drink LOTS of water REST! Continue Tylenol/Ibuprofen as needed for body aches/fevers ADD Claritin or Zyrtec to help w/ nasal/allergy congestion Call with any questions or concerns Hang in there!

## 2017-09-02 NOTE — Progress Notes (Signed)
   Subjective:    Patient ID: Raymond Santana, male    DOB: 12-05-1990, 27 y.o.   MRN: [id number]  HPI URI- sxs started 2 days ago w/ runny nose, facial pressure, HA, body aches, sore throat, loose stools, decreased appetite.  No known fevers.  Taking Advil for body aches.  No known sick contacts but was at a convention over the weekend.  sxs started gradually rather than all of a sudden.   Review of Systems For ROS see HPI     Objective:   Physical Exam  Constitutional: He appears well-developed and well-nourished. No distress.  HENT:  Head: Normocephalic and atraumatic.  Right Ear: Tympanic membrane normal.  Left Ear: Tympanic membrane normal.  Nose: Mucosal edema and rhinorrhea present. Right sinus exhibits maxillary sinus tenderness and frontal sinus tenderness. Left sinus exhibits maxillary sinus tenderness and frontal sinus tenderness.  Mouth/Throat: Mucous membranes are normal. Oropharyngeal exudate and posterior oropharyngeal erythema present. No posterior oropharyngeal edema.  + PND  Eyes: Pupils are equal, round, and reactive to light. Conjunctivae and EOM are normal.  Neck: Normal range of motion. Neck supple.  Cardiovascular: Normal rate, regular rhythm and normal heart sounds.  Pulmonary/Chest: Effort normal and breath sounds normal. No respiratory distress. He has no wheezes.  Lymphadenopathy:    He has no cervical adenopathy.  Skin: Skin is warm and dry.  Vitals reviewed.         Assessment & Plan:  Bacterial sinusitis- new.  Pt's strep was negative.  Sxs and PE consistent w/ sinus infxn.  Start abx.  Reviewed supportive care and red flags that should prompt return.  Pt expressed understanding and is in agreement w/ plan.

## 2017-12-22 ENCOUNTER — Other Ambulatory Visit: Payer: Self-pay | Admitting: Family Medicine

## 2017-12-22 DIAGNOSIS — E781 Pure hyperglyceridemia: Secondary | ICD-10-CM

## 2018-01-07 ENCOUNTER — Other Ambulatory Visit: Payer: Self-pay

## 2018-01-07 ENCOUNTER — Encounter: Payer: Self-pay | Admitting: Family Medicine

## 2018-01-07 ENCOUNTER — Ambulatory Visit: Payer: 59 | Admitting: Family Medicine

## 2018-01-07 VITALS — BP 130/90 | HR 95 | Temp 98.2°F | Resp 18 | Ht 72.0 in | Wt 349.4 lb

## 2018-01-07 DIAGNOSIS — J329 Chronic sinusitis, unspecified: Secondary | ICD-10-CM

## 2018-01-07 DIAGNOSIS — J029 Acute pharyngitis, unspecified: Secondary | ICD-10-CM

## 2018-01-07 DIAGNOSIS — B9689 Other specified bacterial agents as the cause of diseases classified elsewhere: Secondary | ICD-10-CM

## 2018-01-07 LAB — POCT RAPID STREP A (OFFICE): Rapid Strep A Screen: NEGATIVE

## 2018-01-07 MED ORDER — AMOXICILLIN 875 MG PO TABS: 875.0000 mg | ORAL_TABLET | Freq: Two times a day (BID) | ORAL | 0 refills | Status: DC

## 2018-01-07 NOTE — Progress Notes (Signed)
   Subjective:    Patient ID: Raymond Santana, male    DOB: [birth year], 28 y.o.   MRN: [id number]  HPI Sore throat- sxs started 'a couple days ago' w/ vomiting and loose stools.  Initially thought it was due to [employer].  Then developed sore throat and body aches.  + PND.  'a lot of pressure' in head and ears.  + HA.  + strep exposure.  No fevers.  Pt reports ~6 watery stools/day.   Review of Systems For ROS see HPI     Objective:   Physical Exam Vitals signs reviewed.  Constitutional:      General: He is not in acute distress.    Appearance: He is well-developed.  HENT:     Head: Normocephalic and atraumatic.     Right Ear: Tympanic membrane normal.     Left Ear: Tympanic membrane normal.     Nose: Mucosal edema and rhinorrhea present.     Right Sinus: Maxillary sinus tenderness and frontal sinus tenderness present.     Left Sinus: Maxillary sinus tenderness and frontal sinus tenderness present.     Mouth/Throat:     Pharynx: Oropharyngeal exudate and posterior oropharyngeal erythema present.  Eyes:     Conjunctiva/sclera: Conjunctivae normal.     Pupils: Pupils are equal, round, and reactive to light.  Neck:     Musculoskeletal: Normal range of motion and neck supple.  Cardiovascular:     Rate and Rhythm: Normal rate and regular rhythm.     Heart sounds: Normal heart sounds.  Pulmonary:     Effort: Pulmonary effort is normal. No respiratory distress.     Breath sounds: Normal breath sounds. No wheezing.  Lymphadenopathy:     Cervical: No cervical adenopathy.  Skin:    General: Skin is warm and dry.           Assessment & Plan:  Bacterial sinusitis- new.  Pt's sxs and PE consistent w/ infxn.  Start abx.  Reviewed supportive care and red flags that should prompt return.  Pt expressed understanding and is in agreement w/ plan.

## 2018-01-07 NOTE — Patient Instructions (Signed)
Follow up as needed or as scheduled START the Amoxicillin twice daily- take w/ food Drink plenty of fluids REST! Tylenol/Ibuprofen for headache/bodyaches Call with any questions or concerns Happy New Year!!!

## 2018-03-12 ENCOUNTER — Other Ambulatory Visit: Payer: Self-pay | Admitting: Family Medicine

## 2018-04-05 ENCOUNTER — Encounter: Payer: Self-pay | Admitting: Family Medicine

## 2018-04-05 ENCOUNTER — Other Ambulatory Visit: Payer: Self-pay

## 2018-04-05 ENCOUNTER — Ambulatory Visit (INDEPENDENT_AMBULATORY_CARE_PROVIDER_SITE_OTHER): Payer: 59 | Admitting: Family Medicine

## 2018-04-05 DIAGNOSIS — F900 Attention-deficit hyperactivity disorder, predominantly inattentive type: Secondary | ICD-10-CM

## 2018-04-05 MED ORDER — BUPROPION HCL 75 MG PO TABS: 75.0000 mg | ORAL_TABLET | Freq: Two times a day (BID) | ORAL | 3 refills | Status: DC

## 2018-04-05 NOTE — Progress Notes (Signed)
Virtual Visit via Video Note  I connected with Raymond Santana on 04/05/18 at  3:30 PM EDT by a video enabled telemedicine application and verified that I am speaking with the correct person using two identifiers.   I discussed the limitations of evaluation and management by telemedicine and the availability of in person appointments. The patient expressed understanding and agreed to proceed.  Pt is at home while I am in the office  History of Present Illness: ? ADHD- pt reports he is having a difficult time staying on task.  Is constantly having to redirect himself.  He states this has been going on since childhood but parents did not feel ADHD was a real problem and this was not talked about.  Since working from home, sxs have worsened.  He is able to get his work done but it takes him longer than it should and he is being really hard on himself to stay on task.  Has never been tested.  No hyperactive sxs.  Son has ADHD and is on treatment for this.   Observations/Objective: AAOx3, NAD Obese Coloring normal Pt is able to speak clearly, coherently without shortness of breath or increased work of breathing.  Thought process is linear.  Mood is appropriate.   Assessment and Plan: Possible ADHD (inattentive)- new.  Pt feels he has had sxs for years but has never discussed them previously.  Discussed that I am not able to prescribe a stimulant w/o a formal evaluation.  Pt agreeable to this.  In the meantime, discussed starting Strattera or Wellbutrin for symptomatic relief.  Pt elected to start Wellbutrin.  Prescription sent and referral made for formal testing.  Will follow up in 3-4 weeks to assess symptom control on Wellbutrin 75mg  BID.  Pt expressed understanding and is in agreement w/ plan.   Follow Up Instructions: 3-4 weeks.   I discussed the assessment and treatment plan with the patient. The patient was provided an opportunity to ask questions and all were answered. The patient  agreed with the plan and demonstrated an understanding of the instructions.   The patient was advised to call back or seek an in-person evaluation if the symptoms worsen or if the condition fails to improve as anticipated.   Neena Rhymes, MD

## 2018-04-12 ENCOUNTER — Ambulatory Visit: Payer: 59 | Admitting: Family Medicine

## 2018-04-12 ENCOUNTER — Encounter: Payer: 59 | Admitting: Family Medicine

## 2018-04-26 ENCOUNTER — Other Ambulatory Visit: Payer: Self-pay

## 2018-04-26 ENCOUNTER — Encounter: Payer: Self-pay | Admitting: Family Medicine

## 2018-04-26 ENCOUNTER — Ambulatory Visit (INDEPENDENT_AMBULATORY_CARE_PROVIDER_SITE_OTHER): Payer: 59 | Admitting: Family Medicine

## 2018-04-26 DIAGNOSIS — F900 Attention-deficit hyperactivity disorder, predominantly inattentive type: Secondary | ICD-10-CM | POA: Diagnosis not present

## 2018-04-26 NOTE — Progress Notes (Signed)
I have discussed the procedure for the virtual visit with the patient who has given consent to proceed with assessment and treatment.   Wise Fees, CMA     

## 2018-04-26 NOTE — Progress Notes (Signed)
   Virtual Visit via Video   I connected with patient on 04/26/18 at 11:00 AM EDT by a video enabled telemedicine application and verified that I am speaking with the correct person using two identifiers.  Location patient: Home Location provider: [place], Office Persons participating in the virtual visit: Patient, Provider, CMA [facility])  I discussed the limitations of evaluation and management by telemedicine and the availability of in person appointments. The patient expressed understanding and agreed to proceed.  Subjective:   HPI:   ADHD- pt was started on Wellbutrin at last visit.  Pt reports he noticed a 'drastic difference' within a few days.  Focus has improved, able to stay on task at work.  Pt has assessment pending.  Sleep is better.  No palpitations.  Some increased hunger.  ROS:   See pertinent positives and negatives per HPI.  Patient Active Problem List   Diagnosis Date Noted  . Morbid obesity (HCC) 04/08/2017  . Bleeding hemorrhoid 08/09/2015  . Physical exam 02/02/2015  . Wheezing 09/15/2013  . Lumbar back pain 02/18/2013    Social History   Tobacco Use  . Smoking status: Never Smoker  . Smokeless tobacco: Never Used  Substance Use Topics  . Alcohol use: Yes    Comment: ocassionally    Current Outpatient Medications:  .  albuterol (PROVENTIL HFA;VENTOLIN HFA) 108 (90 Base) MCG/ACT inhaler, INHALE TWO PUFFS BY MOUTH EVERY 6 HOURS AS NEEDED FOR  WHEEZING  OR  SHORTNESS  OF  BREATH, Disp: 18 each, Rfl: 3 .  buPROPion (WELLBUTRIN) 75 MG tablet, Take 1 tablet (75 mg total) by mouth 2 (two) times daily., Disp: 60 tablet, Rfl: 3 .  fenofibrate 160 MG tablet, TAKE 1 TABLET BY MOUTH  DAILY, Disp: 90 tablet, Rfl: 1 .  Fexofenadine HCl (ALLEGRA PO), Take 1 tablet by mouth daily., Disp: , Rfl:  .  pantoprazole (PROTONIX) 40 MG tablet, TAKE 1 TABLET BY MOUTH  DAILY, Disp: 90 tablet, Rfl: 1  No Known Allergies  Objective:   Temp 98.6 F (37  C)   Ht 6' (1.829 m)   Wt (!) 337 lb (152.9 kg)   BMI 45.71 kg/m   AAOx3, NAD, obese NCAT, EOMI No obvious CN deficits Coloring WNL Pt is able to speak clearly, coherently without shortness of breath or increased work of breathing.  Thought process is linear.  Mood is appropriate.   Assessment and Plan:   ADHD- improved since starting Wellbutrin.  Pt feels he is able to sleep better, focus better, get more work done.  He is pleased w/ results.  Plans to schedule formal assessment.  No changes at this time   Neena Rhymes, MD 04/26/2018

## 2018-06-16 ENCOUNTER — Other Ambulatory Visit: Payer: Self-pay | Admitting: Family Medicine

## 2018-06-16 DIAGNOSIS — E781 Pure hyperglyceridemia: Secondary | ICD-10-CM

## 2018-07-12 ENCOUNTER — Encounter: Payer: 59 | Admitting: Family Medicine

## 2018-07-13 ENCOUNTER — Ambulatory Visit: Payer: Self-pay | Admitting: Psychology

## 2018-07-20 ENCOUNTER — Ambulatory Visit (INDEPENDENT_AMBULATORY_CARE_PROVIDER_SITE_OTHER): Payer: 59 | Admitting: Psychology

## 2018-07-20 DIAGNOSIS — F909 Attention-deficit hyperactivity disorder, unspecified type: Secondary | ICD-10-CM | POA: Diagnosis not present

## 2018-07-24 ENCOUNTER — Other Ambulatory Visit: Payer: Self-pay | Admitting: Family Medicine

## 2018-08-05 ENCOUNTER — Ambulatory Visit (INDEPENDENT_AMBULATORY_CARE_PROVIDER_SITE_OTHER): Payer: 59 | Admitting: Psychology

## 2018-08-05 DIAGNOSIS — F902 Attention-deficit hyperactivity disorder, combined type: Secondary | ICD-10-CM

## 2018-08-05 DIAGNOSIS — F341 Dysthymic disorder: Secondary | ICD-10-CM

## 2018-08-05 DIAGNOSIS — F4000 Agoraphobia, unspecified: Secondary | ICD-10-CM | POA: Diagnosis not present

## 2018-08-19 ENCOUNTER — Ambulatory Visit (INDEPENDENT_AMBULATORY_CARE_PROVIDER_SITE_OTHER): Payer: 59 | Admitting: Psychology

## 2018-08-19 DIAGNOSIS — F5081 Binge eating disorder: Secondary | ICD-10-CM

## 2018-08-19 DIAGNOSIS — F902 Attention-deficit hyperactivity disorder, combined type: Secondary | ICD-10-CM | POA: Diagnosis not present

## 2018-08-19 DIAGNOSIS — F341 Dysthymic disorder: Secondary | ICD-10-CM | POA: Diagnosis not present

## 2018-08-29 ENCOUNTER — Other Ambulatory Visit: Payer: Self-pay | Admitting: Family Medicine

## 2018-10-21 ENCOUNTER — Encounter: Payer: Self-pay | Admitting: Family Medicine

## 2018-10-21 ENCOUNTER — Ambulatory Visit (INDEPENDENT_AMBULATORY_CARE_PROVIDER_SITE_OTHER): Payer: 59 | Admitting: Family Medicine

## 2018-10-21 ENCOUNTER — Other Ambulatory Visit: Payer: Self-pay

## 2018-10-21 VITALS — BP 128/86 | HR 76 | Temp 98.0°F | Resp 16 | Ht 72.0 in | Wt 338.2 lb

## 2018-10-21 DIAGNOSIS — F4010 Social phobia, unspecified: Secondary | ICD-10-CM | POA: Diagnosis not present

## 2018-10-21 DIAGNOSIS — F902 Attention-deficit hyperactivity disorder, combined type: Secondary | ICD-10-CM | POA: Diagnosis not present

## 2018-10-21 DIAGNOSIS — F341 Dysthymic disorder: Secondary | ICD-10-CM | POA: Insufficient documentation

## 2018-10-21 DIAGNOSIS — R632 Polyphagia: Secondary | ICD-10-CM

## 2018-10-21 DIAGNOSIS — F419 Anxiety disorder, unspecified: Secondary | ICD-10-CM | POA: Insufficient documentation

## 2018-10-21 LAB — CBC WITH DIFFERENTIAL/PLATELET
Basophils Absolute: 0 10*3/uL (ref 0.0–0.1)
Basophils Relative: 0.3 % (ref 0.0–3.0)
Eosinophils Absolute: 0.1 10*3/uL (ref 0.0–0.7)
Eosinophils Relative: 1 % (ref 0.0–5.0)
HCT: 48.8 % (ref 39.0–52.0)
Hemoglobin: 16.4 g/dL (ref 13.0–17.0)
Lymphocytes Relative: 33.2 % (ref 12.0–46.0)
Lymphs Abs: 3.3 10*3/uL (ref 0.7–4.0)
MCHC: 33.6 g/dL (ref 30.0–36.0)
MCV: 82.7 fl (ref 78.0–100.0)
Monocytes Absolute: 0.7 10*3/uL (ref 0.1–1.0)
Monocytes Relative: 7.3 % (ref 3.0–12.0)
Neutro Abs: 5.8 10*3/uL (ref 1.4–7.7)
Neutrophils Relative %: 58.2 % (ref 43.0–77.0)
Platelets: 355 10*3/uL (ref 150.0–400.0)
RBC: 5.9 Mil/uL — ABNORMAL HIGH (ref 4.22–5.81)
RDW: 14.2 % (ref 11.5–15.5)
WBC: 9.9 10*3/uL (ref 4.0–10.5)

## 2018-10-21 LAB — LIPID PANEL
Cholesterol: 201 mg/dL — ABNORMAL HIGH (ref 0–200)
HDL: 32.3 mg/dL — ABNORMAL LOW (ref >39.00)
LDL Cholesterol: 138 mg/dL — ABNORMAL HIGH (ref 0–99)
NonHDL: 169
Total CHOL/HDL Ratio: 6
Triglycerides: 153 mg/dL — ABNORMAL HIGH (ref 0.0–149.0)
VLDL: 30.6 mg/dL (ref 0.0–40.0)

## 2018-10-21 LAB — HEPATIC FUNCTION PANEL
ALT: 84 U/L — ABNORMAL HIGH (ref 0–53)
AST: 47 U/L — ABNORMAL HIGH (ref 0–37)
Albumin: 4.8 g/dL (ref 3.5–5.2)
Alkaline Phosphatase: 54 U/L (ref 39–117)
Bilirubin, Direct: 0.1 mg/dL (ref 0.0–0.3)
Total Bilirubin: 0.6 mg/dL (ref 0.2–1.2)
Total Protein: 7.7 g/dL (ref 6.0–8.3)

## 2018-10-21 LAB — BASIC METABOLIC PANEL
BUN: 11 mg/dL (ref 6–23)
CO2: 24 mEq/L (ref 19–32)
Calcium: 10 mg/dL (ref 8.4–10.5)
Chloride: 103 mEq/L (ref 96–112)
Creatinine, Ser: 1.19 mg/dL (ref 0.40–1.50)
GFR: 71.95 mL/min (ref >60.00)
Glucose, Bld: 70 mg/dL (ref 70–99)
Potassium: 4.3 mEq/L (ref 3.5–5.1)
Sodium: 137 mEq/L (ref 135–145)

## 2018-10-21 LAB — TSH: TSH: 1.88 u[IU]/mL (ref 0.35–4.50)

## 2018-10-21 MED ORDER — LISDEXAMFETAMINE DIMESYLATE 30 MG PO CAPS: 30.0000 mg | ORAL_CAPSULE | Freq: Every day | ORAL | 0 refills | Status: DC

## 2018-10-21 NOTE — Patient Instructions (Signed)
Follow up in 3-4 weeks to recheck ADHD and mood We'll notify you of your lab results and make any changes if needed START the Vyvanse each morning- 1 tab daily Continue to work on healthy diet and regular exercise.  Exercise is a GREAT outlet and release Call with any questions or concerns Hang in there!  We'll get this!

## 2018-10-21 NOTE — Progress Notes (Signed)
   Subjective:    Patient ID: Raymond Santana, male    DOB: 09/02/1990, 28 y.o.   MRN: [id number]  HPI  Pt had a complete psychoeducational assessment w/ Dr Glennon Hamilton in August.  He would like to discuss these results today.  It was determined he had the following diagnoses: - ADHD combined - Social Anxiety - Persistent Depressive disorder - Binge eating  He is currently on Wellbutrin to control both ADHD and anxiety.  This worked for ~2 weeks but then stopped working.  He feels that he does need to start medication as his diagnoses are making life harder for him.  He admits to anger outbursts, inattention, difficulty w/ task completion, anxiety, sadness, binge eating.  Review of Systems For ROS see HPI     Objective:   Physical Exam Vitals signs reviewed.  Constitutional:      Appearance: Normal appearance. He is obese.  HENT:     Head: Normocephalic and atraumatic.  Neurological:     General: No focal deficit present.     Mental Status: He is alert and oriented to person, place, and time.  Psychiatric:        Mood and Affect: Mood normal.        Behavior: Behavior normal.        Thought Content: Thought content normal.           Assessment & Plan:  ADHD/Social anxiety/Persistent Depression/Binge eating- reviewed his assessment with him at length and discussed each of these diagnoses and their interplay.  We discussed that his anxiety is likely stemming from his uncontrolled ADHD and that by treating the ADHD first, this would improve depression and anxiety.  Vyvanse is also approved to treat binge eating so this will definitely improve 2 of the new dxs.  Will start Vyvanse 30mg  daily and monitor closely for improvement.  Total time spent w/ pt >30 minutes and more than 50% spent counseling.  Obesity- check labs to risk stratify for pt's upcoming CPE

## 2018-10-27 ENCOUNTER — Encounter: Payer: 59 | Admitting: Family Medicine

## 2018-11-12 ENCOUNTER — Other Ambulatory Visit: Payer: Self-pay

## 2018-11-12 ENCOUNTER — Encounter: Payer: Self-pay | Admitting: Family Medicine

## 2018-11-12 ENCOUNTER — Ambulatory Visit (INDEPENDENT_AMBULATORY_CARE_PROVIDER_SITE_OTHER): Payer: 59 | Admitting: Family Medicine

## 2018-11-12 DIAGNOSIS — F902 Attention-deficit hyperactivity disorder, combined type: Secondary | ICD-10-CM

## 2018-11-12 MED ORDER — LISDEXAMFETAMINE DIMESYLATE 40 MG PO CAPS: 40.0000 mg | ORAL_CAPSULE | ORAL | 0 refills | Status: DC

## 2018-11-12 NOTE — Progress Notes (Signed)
I have discussed the procedure for the virtual visit with the patient who has given consent to proceed with assessment and treatment.   Pt did not give vitals   Pt states that depression symptoms have resolved, concentration is better. However he feels as though medication begins to taper off around lunchtime. Pt has begun using elliptical 7 days a week for about 10 minutes and has lost some weight.   Pt wife advised him that he is still very irritable and angry at times.  Davis Gourd, CMA

## 2018-11-12 NOTE — Progress Notes (Signed)
   Virtual Visit via Video   I connected with patient on 11/12/18 at  3:30 PM EST by a video enabled telemedicine application and verified that I am speaking with the correct person using two identifiers.  Location patient: Home Location provider: Acupuncturist, Office Persons participating in the virtual visit: Patient, Provider, [place] (Jess B)  I discussed the limitations of evaluation and management by telemedicine and the availability of in person appointments. The patient expressed understanding and agreed to proceed.  Subjective:   HPI:   ADHD- started on Vyvanse last visit.  'I can tell a slight difference'.  Increased focus, 'a lot more'.  Medication wears off by lunch.  'I can definitely tell it helps'.  Energy is 'better'.  Motivation 'I can tell a shift'.  Continues to have 'persistant irritability'.  Pt is now exercising- using the elliptical daily for 10 minutes.  ROS:   See pertinent positives and negatives per HPI.  Patient Active Problem List   Diagnosis Date Noted  . ADHD (attention deficit hyperactivity disorder), combined type 10/21/2018  . Social anxiety disorder 10/21/2018  . Persistent depressive disorder 10/21/2018  . Binge eating 10/21/2018  . Bleeding hemorrhoid 08/09/2015  . Wheezing 09/15/2013  . Lumbar back pain 02/18/2013    Social History   Tobacco Use  . Smoking status: Never Smoker  . Smokeless tobacco: Never Used  Substance Use Topics  . Alcohol use: Yes    Comment: ocassionally    Current Outpatient Medications:  .  albuterol (PROVENTIL HFA;VENTOLIN HFA) 108 (90 Base) MCG/ACT inhaler, INHALE TWO PUFFS BY MOUTH EVERY 6 HOURS AS NEEDED FOR  WHEEZING  OR  SHORTNESS  OF  BREATH, Disp: 18 each, Rfl: 3 .  fenofibrate 160 MG tablet, TAKE 1 TABLET BY MOUTH  DAILY, Disp: 90 tablet, Rfl: 1 .  Fexofenadine HCl (ALLEGRA PO), Take 1 tablet by mouth daily., Disp: , Rfl:  .  lisdexamfetamine (VYVANSE) 30 MG capsule, Take 1 capsule (30 mg total) by  mouth daily., Disp: 30 capsule, Rfl: 0 .  pantoprazole (PROTONIX) 40 MG tablet, TAKE 1 TABLET BY MOUTH  DAILY, Disp: 90 tablet, Rfl: 3  No Known Allergies  Objective:   There were no vitals taken for this visit. AAOx3, NAD NCAT, EOMI No obvious CN deficits Coloring WNL Pt is able to speak clearly, coherently without shortness of breath or increased work of breathing.  Thought process is linear.  Mood is appropriate.   Assessment and Plan:   ADHD- improved since starting Vyvanse.  Pt feels it wears off around lunch.  Would like to try increasing the dose rather than adding an additional medication for afternoon sxs.  Still having irritability but wants to get ADHD dosing correct prior to adding new meds.  Will continue to follow closely.   Annye Asa, MD 11/12/2018

## 2018-11-18 ENCOUNTER — Telehealth: Payer: Self-pay | Admitting: Family Medicine

## 2018-11-18 NOTE — Telephone Encounter (Signed)
LM for pt to call back to schedule an 3wk reck on  ADHD, we can do that virtual

## 2018-11-26 ENCOUNTER — Ambulatory Visit
Admission: EM | Admit: 2018-11-26 | Discharge: 2018-11-26 | Disposition: A | Discharge status: Home / Self Care | Payer: 59 | Attending: Physician Assistant | Admitting: Physician Assistant

## 2018-11-26 ENCOUNTER — Other Ambulatory Visit: Payer: Self-pay

## 2018-11-26 DIAGNOSIS — J069 Acute upper respiratory infection, unspecified: Secondary | ICD-10-CM | POA: Insufficient documentation

## 2018-11-26 DIAGNOSIS — Z20828 Contact with and (suspected) exposure to other viral communicable diseases: Secondary | ICD-10-CM

## 2018-11-26 LAB — POCT RAPID STREP A (OFFICE): Rapid Strep A Screen: NEGATIVE

## 2018-11-26 LAB — POC SARS CORONAVIRUS 2 AG -  ED: SARS Coronavirus 2 Ag: NEGATIVE

## 2018-11-26 MED ORDER — ALBUTEROL SULFATE HFA 108 (90 BASE) MCG/ACT IN AERS: INHALATION_SPRAY | RESPIRATORY_TRACT | 0 refills | Status: DC

## 2018-11-26 MED ORDER — LIDOCAINE VISCOUS HCL 2 % MT SOLN: OROMUCOSAL | 0 refills | Status: DC

## 2018-11-26 NOTE — ED Triage Notes (Signed)
Pt c/o sore throat with white patches at the roof of his mouth and feeling sluggish for the past 2 days

## 2018-11-26 NOTE — Discharge Instructions (Signed)
Rapid strep negative. Rapid COVID negative. PCR testing ordered. I would like you to quarantine until testing results. Start lidocaine for sore throat, do not eat or drink for the next 40 mins after use as it can stunt your gag reflex. You can take over the counter flonase/nasacort to help with nasal congestion/drainage. If experiencing shortness of breath, trouble breathing, go to the emergency department for further evaluation needed.

## 2018-11-26 NOTE — ED Provider Notes (Signed)
[name]    CSN: [id number] Arrival date & time: 11/26/18  0827      History   Chief Complaint Chief Complaint  Patient presents with  . Sore Throat    HPI Raymond Santana is a 28 y.o. male.   28 year old male with history of asthma comes in for 2 day of URI symptoms. He has had sore throat with white patches to the roof of the mouth. Has felt more fatigued than normal. Has had rhinorrhea. No cough. Has had painful swallowing without tripoding, drooling, trismus. Denies fever, chills, body aches. Denies abdominal pain, nausea, vomiting, diarrhea. Denies shortness of breath, loss of taste/smell. Never smoker. Work from home. Wife was sick but without COVID testing.      Past Medical History:  Diagnosis Date  . Asthma   . Hemorrhoid 2017  . Hypertension   . Pilonidal cyst     Patient Active Problem List   Diagnosis Date Noted  . ADHD (attention deficit hyperactivity disorder), combined type 10/21/2018  . Social anxiety disorder 10/21/2018  . Persistent depressive disorder 10/21/2018  . Binge eating 10/21/2018  . Bleeding hemorrhoid 08/09/2015  . Wheezing 09/15/2013  . Lumbar back pain 02/18/2013    Past Surgical History:  Procedure Laterality Date  . APPENDECTOMY    . CYSTECTOMY     Buttock  . Staph infection     L toe  . WISDOM TOOTH EXTRACTION Bilateral        Home Medications    Prior to Admission medications   Medication Sig Start Date End Date Taking? Authorizing Provider  albuterol (VENTOLIN HFA) 108 (90 Base) MCG/ACT inhaler INHALE TWO PUFFS BY MOUTH EVERY 6 HOURS AS NEEDED FOR  WHEEZING  OR  SHORTNESS  OF  BREATH 11/26/18   Cathie HoopsYu, Naszir Cott V, PA-C  fenofibrate 160 MG tablet TAKE 1 TABLET BY MOUTH  DAILY 06/17/18   Sheliah Hatchabori, Katherine E, MD  Fexofenadine HCl (ALLEGRA PO) Take 1 tablet by mouth daily.    [provider]  lidocaine (XYLOCAINE) 2 % solution 5-15 mL gurgle as needed 11/26/18   Cathie HoopsYu, Nubia Ziesmer V, PA-C  lisdexamfetamine  (VYVANSE) 40 MG capsule Take 1 capsule (40 mg total) by mouth every morning. 11/12/18   Sheliah Hatchabori, Katherine E, MD  pantoprazole (PROTONIX) 40 MG tablet TAKE 1 TABLET BY MOUTH  DAILY 08/30/18   Sheliah Hatchabori, Katherine E, MD    Family History Family History  Problem Relation Age of Onset  . Diabetes Mother        mother's side of family  . Hypertension Mother        mother's side of family  . Barrett's esophagus Father   . Diabetes Other        father's side of family, Paternal [place] aunt  . Colon cancer Paternal Grandmother     Social History Social History   Tobacco Use  . Smoking status: Never Smoker  . Smokeless tobacco: Never Used  Substance Use Topics  . Alcohol use: Yes    Comment: ocassionally  . Drug use: No     Allergies   Patient has no known allergies.   Review of Systems Review of Systems  Reason unable to perform ROS: See HPI as above.     Physical Exam Triage Vital Signs ED Triage Vitals  Enc Vitals Group     BP 11/26/18 0842 (!) 143/94     Pulse Rate 11/26/18 0842 84     Resp 11/26/18 0842 16  Temp 11/26/18 0842 98 F (36.7 C)     Temp Source 11/26/18 0842 Oral     SpO2 11/26/18 0842 98 %     Weight --      Height --      Head Circumference --      Peak Flow --      Pain Score 11/26/18 0843 4     Pain Loc --      Pain Edu? --      Excl. in [place]? --    No data found.  Updated Vital Signs BP (!) 143/94 (BP Location: Left Arm)   Pulse 84   Temp 98 F (36.7 C) (Oral)   Resp 16   SpO2 98%   Physical Exam Constitutional:      General: He is not in acute distress.    Appearance: Normal appearance. He is not ill-appearing, toxic-appearing or diaphoretic.  HENT:     Head: Normocephalic and atraumatic.     Mouth/Throat:     Mouth: Mucous membranes are moist.     Pharynx: Oropharynx is clear. Uvula midline.     Tonsils: No tonsillar exudate. 1+ on the right. 1+ on the left.     Comments: Isolated sore to the right roof of mouth. No other sores  seen. Neck:     Musculoskeletal: Normal range of motion and neck supple.  Cardiovascular:     Rate and Rhythm: Normal rate and regular rhythm.     Heart sounds: Normal heart sounds. No murmur. No friction rub. No gallop.   Pulmonary:     Effort: Pulmonary effort is normal. No accessory muscle usage, prolonged expiration, respiratory distress or retractions.     Comments: Speaking in full sentences without difficulty. Lungs clear to auscultation without adventitious lung sounds. Skin:    General: Skin is warm and dry.  Neurological:     General: No focal deficit present.     Mental Status: He is alert and oriented to person, place, and time.      UC Treatments / Results  Labs (all labs ordered are listed, but only abnormal results are displayed) Labs Reviewed  POCT RAPID STREP A (OFFICE) - Normal  POC SARS CORONAVIRUS 2 AG -  ED - Normal  CULTURE, GROUP A STREP ([place])  NOVEL CORONAVIRUS, NAA    EKG   Radiology No results found.  Procedures Procedures (including critical care time)  Medications Ordered in UC Medications - No data to display  Initial Impression / Assessment and Plan / UC Course  I have reviewed the triage vital signs and the nursing notes.  Pertinent labs & imaging results that were available during my care of the patient were reviewed by me and considered in my medical decision making (see chart for details).    Rapid strep negative. Rapid COVID negative. PCR test ordered. Patient to quarantine until testing results return. No alarming signs on exam.  Patient speaking in full sentences without respiratory distress.  Symptomatic treatment discussed.  Push fluids.  Return precautions given.  Patient expresses understanding and agrees to plan.  Final Clinical Impressions(s) / UC Diagnoses   Final diagnoses:  Viral URI   ED Prescriptions    Medication Sig Dispense Auth. Provider   albuterol (VENTOLIN HFA) 108 (90 Base) MCG/ACT inhaler INHALE TWO PUFFS  BY MOUTH EVERY 6 HOURS AS NEEDED FOR  WHEEZING  OR  SHORTNESS  OF  BREATH 8 g Lanaysia Fritchman V, PA-C   lidocaine (XYLOCAINE) 2 % solution  5-15 mL gurgle as needed 150 mL Belinda Fisher, PA-C     PDMP not reviewed this encounter.   Belinda Fisher, PA-C 11/26/18 [phone]

## 2018-11-28 LAB — CULTURE, GROUP A STREP (THRC)

## 2018-11-29 LAB — NOVEL CORONAVIRUS, NAA: SARS-CoV-2, NAA: NOT DETECTED

## 2018-11-30 ENCOUNTER — Other Ambulatory Visit: Payer: Self-pay | Admitting: Family Medicine

## 2018-11-30 DIAGNOSIS — E781 Pure hyperglyceridemia: Secondary | ICD-10-CM

## 2018-12-18 ENCOUNTER — Other Ambulatory Visit: Payer: Self-pay | Admitting: Family Medicine

## 2018-12-20 ENCOUNTER — Ambulatory Visit (INDEPENDENT_AMBULATORY_CARE_PROVIDER_SITE_OTHER): Payer: 59 | Admitting: Family Medicine

## 2018-12-20 ENCOUNTER — Encounter: Payer: Self-pay | Admitting: Family Medicine

## 2018-12-20 ENCOUNTER — Other Ambulatory Visit: Payer: Self-pay

## 2018-12-20 VITALS — Temp 97.8°F | Ht 72.0 in | Wt 326.0 lb

## 2018-12-20 DIAGNOSIS — F902 Attention-deficit hyperactivity disorder, combined type: Secondary | ICD-10-CM

## 2018-12-20 MED ORDER — LISDEXAMFETAMINE DIMESYLATE 50 MG PO CAPS: 50.0000 mg | ORAL_CAPSULE | Freq: Every day | ORAL | 0 refills | Status: DC

## 2018-12-20 NOTE — Telephone Encounter (Signed)
Last OV 11/12/18 vyvanse last filled 11/12/18 #30 with 0

## 2018-12-20 NOTE — Progress Notes (Signed)
   Virtual Visit via Video   I connected with patient on 12/20/18 at  1:00 PM EST by a video enabled telemedicine application and verified that I am speaking with the correct person using two identifiers.  Location patient: Home Location provider: Acupuncturist, Office Persons participating in the virtual visit: Patient, Provider, [place] (Jess B)  I discussed the limitations of evaluation and management by telemedicine and the availability of in person appointments. The patient expressed understanding and agreed to proceed.  Subjective:   HPI:  ADHD- at last visit Vyvanse was increased to 40mg  daily.  'focus has gotten a lot better'.  His 'crash' occurs later in the day.  Mood has 'gotten a lot better'.  Much less irritable.    Obesity- pt is down 12 lbs.  Pt reports eating less.  Feels sustained w/ less.  Has not changed exercise habits.  ROS:   See pertinent positives and negatives per HPI.  Patient Active Problem List   Diagnosis Date Noted  . ADHD (attention deficit hyperactivity disorder), combined type 10/21/2018  . Social anxiety disorder 10/21/2018  . Persistent depressive disorder 10/21/2018  . Binge eating 10/21/2018  . Bleeding hemorrhoid 08/09/2015  . Wheezing 09/15/2013  . Lumbar back pain 02/18/2013    Social History   Tobacco Use  . Smoking status: Never Smoker  . Smokeless tobacco: Never Used  Substance Use Topics  . Alcohol use: Yes    Comment: ocassionally    Current Outpatient Medications:  .  albuterol (VENTOLIN HFA) 108 (90 Base) MCG/ACT inhaler, INHALE TWO PUFFS BY MOUTH EVERY 6 HOURS AS NEEDED FOR  WHEEZING  OR  SHORTNESS  OF  BREATH, Disp: 8 g, Rfl: 0 .  fenofibrate 160 MG tablet, TAKE 1 TABLET BY MOUTH  DAILY, Disp: 90 tablet, Rfl: 3 .  Fexofenadine HCl (ALLEGRA PO), Take 1 tablet by mouth daily., Disp: , Rfl:  .  lidocaine (XYLOCAINE) 2 % solution, 5-15 mL gurgle as needed, Disp: 150 mL, Rfl: 0 .  lisdexamfetamine (VYVANSE) 40 MG capsule,  Take 1 capsule (40 mg total) by mouth every morning., Disp: 30 capsule, Rfl: 0 .  pantoprazole (PROTONIX) 40 MG tablet, TAKE 1 TABLET BY MOUTH  DAILY, Disp: 90 tablet, Rfl: 3  No Known Allergies  Objective:   Temp 97.8 F (36.6 C) (Tympanic)   Ht 6' (1.829 m)   Wt (!) 326 lb (147.9 kg)   BMI 44.21 kg/m  AAOx3, NAD NCAT, EOMI No obvious CN deficits Coloring WNL Pt is able to speak clearly, coherently without shortness of breath or increased work of breathing.  Thought process is linear.  Mood is appropriate.   Assessment and Plan:   ADHD- improved since increasing Vyvanse to 40mg  daily.  Since his medication is wearing off around 2:30pm will try and increase to 50mg  and see if he can get through his work day.  Pt expressed understanding and is in agreement w/ plan.   Obesity- pt is down 12 lbs since last visit.  Encouraged him to add regular exercise to his change in eating habits.  Will follow.   Annye Asa, MD 12/20/2018

## 2018-12-20 NOTE — Progress Notes (Signed)
I have discussed the procedure for the virtual visit with the patient who has given consent to proceed with assessment and treatment.   Pt unable to obtain vitals.   Raymond Santana, CMA     

## 2018-12-25 ENCOUNTER — Other Ambulatory Visit: Payer: Self-pay | Admitting: Family Medicine

## 2018-12-27 NOTE — Telephone Encounter (Signed)
Please advise, should pt still be on this?  

## 2019-01-18 ENCOUNTER — Other Ambulatory Visit: Payer: Self-pay | Admitting: Family Medicine

## 2019-01-18 MED ORDER — LISDEXAMFETAMINE DIMESYLATE 50 MG PO CAPS: 50.0000 mg | ORAL_CAPSULE | Freq: Every day | ORAL | 0 refills | Status: DC

## 2019-01-18 NOTE — Telephone Encounter (Signed)
Last OV 12/20/18 Vyvanse last filled 12/20/18 #30 with 0

## 2019-02-14 ENCOUNTER — Other Ambulatory Visit: Payer: Self-pay | Admitting: Family Medicine

## 2019-02-14 NOTE — Telephone Encounter (Signed)
Last OV 12/20/18 vyvanse last filled 01/18/19 #30 with 0

## 2019-02-15 MED ORDER — LISDEXAMFETAMINE DIMESYLATE 50 MG PO CAPS: 50.0000 mg | ORAL_CAPSULE | Freq: Every day | ORAL | 0 refills | Status: DC

## 2019-03-18 ENCOUNTER — Other Ambulatory Visit: Payer: Self-pay | Admitting: Family Medicine

## 2019-03-18 NOTE — Telephone Encounter (Signed)
Last OV 12/20/18 Vyvanse last filled 02/15/19 #30 with 0

## 2019-03-21 MED ORDER — LISDEXAMFETAMINE DIMESYLATE 50 MG PO CAPS: 50.0000 mg | ORAL_CAPSULE | Freq: Every day | ORAL | 0 refills | Status: DC

## 2019-04-17 ENCOUNTER — Other Ambulatory Visit: Payer: Self-pay | Admitting: Family Medicine

## 2019-04-18 MED ORDER — LISDEXAMFETAMINE DIMESYLATE 50 MG PO CAPS: 50.0000 mg | ORAL_CAPSULE | Freq: Every day | ORAL | 0 refills | Status: DC

## 2019-04-18 NOTE — Telephone Encounter (Signed)
Last OV 12/20/18 Vyvanse last filled 03/21/19 #30 with 0

## 2019-05-20 ENCOUNTER — Other Ambulatory Visit: Payer: Self-pay | Admitting: Family Medicine

## 2019-05-20 MED ORDER — LISDEXAMFETAMINE DIMESYLATE 50 MG PO CAPS: 50.0000 mg | ORAL_CAPSULE | Freq: Every day | ORAL | 0 refills | Status: DC

## 2019-05-20 NOTE — Telephone Encounter (Signed)
Last OV 12/20/18 Vyvanse last filled 04/18/19 #30 with 0

## 2019-06-20 ENCOUNTER — Other Ambulatory Visit: Payer: Self-pay | Admitting: Family Medicine

## 2019-06-21 MED ORDER — LISDEXAMFETAMINE DIMESYLATE 50 MG PO CAPS: 50.0000 mg | ORAL_CAPSULE | Freq: Every day | ORAL | 0 refills | Status: DC

## 2019-06-21 NOTE — Telephone Encounter (Signed)
Last OV 21/14/20 Vyvanse last filled 05/20/19 #30 with 0

## 2019-07-20 ENCOUNTER — Other Ambulatory Visit: Payer: Self-pay | Admitting: Family Medicine

## 2019-07-21 ENCOUNTER — Other Ambulatory Visit: Payer: Self-pay | Admitting: Family Medicine

## 2019-07-21 MED ORDER — LISDEXAMFETAMINE DIMESYLATE 50 MG PO CAPS: 50.0000 mg | ORAL_CAPSULE | Freq: Every day | ORAL | 0 refills | Status: DC

## 2019-07-21 NOTE — Telephone Encounter (Signed)
Last OV 12/20/18 Vyvanse last filled 06/21/19 #30 with 0

## 2019-08-20 ENCOUNTER — Other Ambulatory Visit: Payer: Self-pay | Admitting: Family Medicine

## 2019-08-22 MED ORDER — LISDEXAMFETAMINE DIMESYLATE 50 MG PO CAPS: 50.0000 mg | ORAL_CAPSULE | Freq: Every day | ORAL | 0 refills | Status: DC

## 2019-08-22 NOTE — Telephone Encounter (Signed)
Last OV 12/20/18 Vyvanse last filled 07/21/19 #30 with 0

## 2019-09-20 ENCOUNTER — Other Ambulatory Visit: Payer: Self-pay | Admitting: Family Medicine

## 2019-09-20 MED ORDER — LISDEXAMFETAMINE DIMESYLATE 50 MG PO CAPS: 50.0000 mg | ORAL_CAPSULE | Freq: Every day | ORAL | 0 refills | Status: DC

## 2019-09-20 NOTE — Telephone Encounter (Signed)
Last OV 12/20/18 Vyvanse last filled 08/22/19 #30 with 0

## 2019-10-18 ENCOUNTER — Other Ambulatory Visit: Payer: Self-pay | Admitting: Family Medicine

## 2019-10-18 NOTE — Telephone Encounter (Signed)
Last OV 12/20/18 Vyvanse last filled 09/20/19 #30 with 0

## 2019-10-20 MED ORDER — LISDEXAMFETAMINE DIMESYLATE 50 MG PO CAPS: 50.0000 mg | ORAL_CAPSULE | Freq: Every day | ORAL | 0 refills | Status: DC

## 2019-11-04 ENCOUNTER — Other Ambulatory Visit: Payer: Self-pay

## 2019-11-04 ENCOUNTER — Ambulatory Visit (INDEPENDENT_AMBULATORY_CARE_PROVIDER_SITE_OTHER): Payer: 59 | Admitting: Family Medicine

## 2019-11-04 ENCOUNTER — Encounter: Payer: Self-pay | Admitting: Family Medicine

## 2019-11-04 VITALS — BP 121/80 | HR 100 | Temp 99.2°F | Resp 16 | Ht 72.0 in | Wt 322.0 lb

## 2019-11-04 DIAGNOSIS — Z0000 Encounter for general adult medical examination without abnormal findings: Secondary | ICD-10-CM | POA: Diagnosis not present

## 2019-11-04 LAB — CBC WITH DIFFERENTIAL/PLATELET
Basophils Absolute: 0 10*3/uL (ref 0.0–0.1)
Basophils Relative: 0.3 % (ref 0.0–3.0)
Eosinophils Absolute: 0.2 10*3/uL (ref 0.0–0.7)
Eosinophils Relative: 2.2 % (ref 0.0–5.0)
HCT: 50.2 % (ref 39.0–52.0)
Hemoglobin: 16.7 g/dL (ref 13.0–17.0)
Lymphocytes Relative: 30.9 % (ref 12.0–46.0)
Lymphs Abs: 3.1 10*3/uL (ref 0.7–4.0)
MCHC: 33.3 g/dL (ref 30.0–36.0)
MCV: 81.4 fl (ref 78.0–100.0)
Monocytes Absolute: 0.8 10*3/uL (ref 0.1–1.0)
Monocytes Relative: 7.8 % (ref 3.0–12.0)
Neutro Abs: 6 10*3/uL (ref 1.4–7.7)
Neutrophils Relative %: 58.8 % (ref 43.0–77.0)
Platelets: 352 10*3/uL (ref 150.0–400.0)
RBC: 6.17 Mil/uL — ABNORMAL HIGH (ref 4.22–5.81)
RDW: 13.8 % (ref 11.5–15.5)
WBC: 10.1 10*3/uL (ref 4.0–10.5)

## 2019-11-04 LAB — HEPATIC FUNCTION PANEL
ALT: 55 U/L — ABNORMAL HIGH (ref 0–53)
AST: 29 U/L (ref 0–37)
Albumin: 4.8 g/dL (ref 3.5–5.2)
Alkaline Phosphatase: 70 U/L (ref 39–117)
Bilirubin, Direct: 0.1 mg/dL (ref 0.0–0.3)
Total Bilirubin: 0.7 mg/dL (ref 0.2–1.2)
Total Protein: 7.9 g/dL (ref 6.0–8.3)

## 2019-11-04 LAB — BASIC METABOLIC PANEL
BUN: 15 mg/dL (ref 6–23)
CO2: 27 mEq/L (ref 19–32)
Calcium: 10 mg/dL (ref 8.4–10.5)
Chloride: 100 mEq/L (ref 96–112)
Creatinine, Ser: 1.02 mg/dL (ref 0.40–1.50)
GFR: 98.5 mL/min (ref >60.00)
Glucose, Bld: 70 mg/dL (ref 70–99)
Potassium: 4.2 mEq/L (ref 3.5–5.1)
Sodium: 137 mEq/L (ref 135–145)

## 2019-11-04 LAB — TSH: TSH: 1.95 u[IU]/mL (ref 0.35–4.50)

## 2019-11-04 LAB — LIPID PANEL
Cholesterol: 211 mg/dL — ABNORMAL HIGH (ref 0–200)
HDL: 41.1 mg/dL (ref >39.00)
LDL Cholesterol: 134 mg/dL — ABNORMAL HIGH (ref 0–99)
NonHDL: 169.68
Total CHOL/HDL Ratio: 5
Triglycerides: 178 mg/dL — ABNORMAL HIGH (ref 0.0–149.0)
VLDL: 35.6 mg/dL (ref 0.0–40.0)

## 2019-11-04 NOTE — Assessment & Plan Note (Signed)
Pt's PE WNL w/ exception of obesity.  UTD on COVID, Tdap.  Declines flu.  Check labs.  Anticipatory guidance provided.

## 2019-11-04 NOTE — Progress Notes (Signed)
   Subjective:    Patient ID: Raymond Santana, male    DOB: 1990/10/10, 29 y.o.   MRN: [id number]  HPI CPE- UTD on Tdap, COVID.  Declines flu shot  Reviewed past medical, surgical, family and social histories.    Review of Systems Patient reports no vision/hearing changes, anorexia, fever ,adenopathy, persistant/recurrent hoarseness, swallowing issues, chest pain, palpitations, edema, persistant/recurrent cough, hemoptysis, dyspnea (rest,exertional, paroxysmal nocturnal), gastrointestinal  bleeding (melena, rectal bleeding), abdominal pain, excessive heart burn, GU symptoms (dysuria, hematuria, voiding/incontinence issues) syncope, focal weakness, memory loss, skin/hair/nail changes, depression, anxiety, abnormal bruising/bleeding, musculoskeletal symptoms/signs.   + worsening allergies- is cycling through multiple OTC meds.  Currently on Zyrtec.  Does not use nasal sprays typically.   + numbness of R arm due to ulnar entrapment  This visit occurred during the SARS-CoV-2 public health emergency.  Safety protocols were in place, including screening questions prior to the visit, additional usage of staff PPE, and extensive cleaning of exam room while observing appropriate contact time as indicated for disinfecting solutions.       Objective:   Physical Exam General Appearance:    Alert, cooperative, no distress, appears stated age  Head:    Normocephalic, without obvious abnormality, atraumatic  Eyes:    PERRL, conjunctiva/corneas clear, EOM's intact, fundi    benign, both eyes       Ears:    Normal TM's and external ear canals, both ears  Nose:   Deferred due to COVID  Throat:   Neck:   Supple, symmetrical, trachea midline, no adenopathy;       thyroid:  No enlargement/tenderness/nodules  Back:     Symmetric, no curvature, ROM normal, no CVA tenderness  Lungs:     Clear to auscultation bilaterally, respirations unlabored  Chest wall:    No tenderness or deformity  Heart:    Regular  rate and rhythm, S1 and S2 normal, no murmur, rub   or gallop  Abdomen:     Soft, non-tender, bowel sounds active all four quadrants,    no masses, no organomegaly  Genitalia:    Deferred  Rectal:    Extremities:   Extremities normal, atraumatic, no cyanosis or edema  Pulses:   2+ and symmetric all extremities  Skin:   Skin color, texture, turgor normal, no rashes or lesions  Lymph nodes:   Cervical, supraclavicular, and axillary nodes normal  Neurologic:   CNII-XII intact. Normal strength, sensation and reflexes      throughout          Assessment & Plan:

## 2019-11-04 NOTE — Patient Instructions (Signed)
Follow up in 6 months to recheck weight loss progress We'll notify you of your lab results and make any changes if needed Continue to work on healthy diet and regular exercise- you can do it! Add Flonase (Fluticasone) to your daily allergy medicine Call with any questions or concerns Stay Safe!  Stay Healthy!

## 2019-11-04 NOTE — Assessment & Plan Note (Signed)
Ongoing issue for pt.  Stressed need for healthy diet and regular exercise.  Will check labs to risk stratify.

## 2019-11-08 ENCOUNTER — Encounter: Payer: Self-pay | Admitting: General Practice

## 2019-11-20 ENCOUNTER — Other Ambulatory Visit: Payer: Self-pay | Admitting: Family Medicine

## 2019-11-21 MED ORDER — LISDEXAMFETAMINE DIMESYLATE 50 MG PO CAPS: 50.0000 mg | ORAL_CAPSULE | Freq: Every day | ORAL | 0 refills | Status: DC

## 2019-11-21 NOTE — Telephone Encounter (Signed)
Vyvanse last rx 10/20/19 #30 LOV: 11/04/19 for CPE

## 2019-12-19 ENCOUNTER — Other Ambulatory Visit: Payer: Self-pay | Admitting: Family Medicine

## 2019-12-20 MED ORDER — LISDEXAMFETAMINE DIMESYLATE 50 MG PO CAPS: 50.0000 mg | ORAL_CAPSULE | Freq: Every day | ORAL | 0 refills | Status: DC

## 2020-01-19 ENCOUNTER — Other Ambulatory Visit: Payer: Self-pay | Admitting: Family Medicine

## 2020-01-19 MED ORDER — LISDEXAMFETAMINE DIMESYLATE 50 MG PO CAPS: 50.0000 mg | ORAL_CAPSULE | Freq: Every day | ORAL | 0 refills | Status: DC

## 2020-01-19 NOTE — Telephone Encounter (Signed)
LR: 12-20-2019 Qty: 30 with 0 refills  Last office visit: 11-04-2019 Upcoming appointment: No pending appt

## 2020-02-21 ENCOUNTER — Other Ambulatory Visit: Payer: Self-pay | Admitting: Physician Assistant

## 2020-02-21 MED ORDER — LISDEXAMFETAMINE DIMESYLATE 50 MG PO CAPS: 50.0000 mg | ORAL_CAPSULE | Freq: Every day | ORAL | 0 refills | Status: DC

## 2020-02-21 NOTE — Telephone Encounter (Signed)
Last OV- 11/04/19 Last refills 01/19/19 30 tab refill:0

## 2020-03-21 ENCOUNTER — Other Ambulatory Visit: Payer: Self-pay | Admitting: Family Medicine

## 2020-03-22 MED ORDER — LISDEXAMFETAMINE DIMESYLATE 50 MG PO CAPS: 50.0000 mg | ORAL_CAPSULE | Freq: Every day | ORAL | 0 refills | Status: DC

## 2020-03-22 NOTE — Telephone Encounter (Signed)
Last refill: 02/21/20 #30, 0 Last OV: 11/04/19 dx. CPE

## 2020-04-23 ENCOUNTER — Other Ambulatory Visit: Payer: Self-pay | Admitting: Family Medicine

## 2020-04-23 NOTE — Telephone Encounter (Signed)
Patient is requesting a refill of the following medications: Requested Prescriptions   Pending Prescriptions Disp Refills  . lisdexamfetamine (VYVANSE) 50 MG capsule 30 capsule 0    Sig: Take 1 capsule (50 mg total) by mouth daily.    Date of patient request: 04/23/2020 Last office visit: 11/04/2019 Date of last refill: 03/22/2020 Last refill amount: 30 capsules Follow up time period per chart: none

## 2020-04-25 MED ORDER — LISDEXAMFETAMINE DIMESYLATE 50 MG PO CAPS: 50.0000 mg | ORAL_CAPSULE | Freq: Every day | ORAL | 0 refills | Status: DC

## 2020-05-22 ENCOUNTER — Other Ambulatory Visit: Payer: Self-pay | Admitting: Family Medicine

## 2020-05-22 MED ORDER — LISDEXAMFETAMINE DIMESYLATE 50 MG PO CAPS: 50.0000 mg | ORAL_CAPSULE | Freq: Every day | ORAL | 0 refills | Status: DC

## 2020-05-22 NOTE — Telephone Encounter (Signed)
LFD 04/25/20 #30 with no refills LOV 11/04/19 NOV none

## 2020-06-05 ENCOUNTER — Other Ambulatory Visit: Payer: Self-pay

## 2020-06-05 ENCOUNTER — Telehealth: Payer: Self-pay | Admitting: Family Medicine

## 2020-06-05 MED ORDER — ALBUTEROL SULFATE HFA 108 (90 BASE) MCG/ACT IN AERS: INHALATION_SPRAY | RESPIRATORY_TRACT | 1 refills | Status: AC

## 2020-06-05 NOTE — Telephone Encounter (Signed)
Medication sent to pharmacy  

## 2020-06-05 NOTE — Telephone Encounter (Signed)
Pt called in asking for a new script on the ventolin, says that Beverely Low is the original one who  Prescribed the medication. [facility] [address].  Please advise pt has covid and needs the inhaler.  Please call if there is a problem filling the medication.

## 2020-06-22 ENCOUNTER — Other Ambulatory Visit: Payer: Self-pay | Admitting: Family Medicine

## 2020-06-22 MED ORDER — LISDEXAMFETAMINE DIMESYLATE 50 MG PO CAPS: 50.0000 mg | ORAL_CAPSULE | Freq: Every day | ORAL | 0 refills | Status: DC

## 2020-06-22 NOTE — Telephone Encounter (Signed)
Last refill: 05/22/20 #30, 0 Last OV: 11/04/19 dx. CPE

## 2020-07-04 ENCOUNTER — Encounter: Payer: Self-pay | Admitting: *Deleted

## 2020-07-20 ENCOUNTER — Other Ambulatory Visit: Payer: Self-pay | Admitting: Family Medicine

## 2020-07-20 NOTE — Telephone Encounter (Signed)
Last filled 06/22/20 #30 with no refills Last visit 11/04/19 Next visit none

## 2020-07-23 MED ORDER — LISDEXAMFETAMINE DIMESYLATE 50 MG PO CAPS: 50.0000 mg | ORAL_CAPSULE | Freq: Every day | ORAL | 0 refills | Status: DC

## 2020-08-21 ENCOUNTER — Other Ambulatory Visit: Payer: Self-pay

## 2020-08-21 ENCOUNTER — Telehealth: Payer: Self-pay

## 2020-08-21 MED ORDER — LISDEXAMFETAMINE DIMESYLATE 50 MG PO CAPS: 50.0000 mg | ORAL_CAPSULE | Freq: Every day | ORAL | 0 refills | Status: DC

## 2020-08-21 NOTE — Telephone Encounter (Signed)
Caller name:  Reeder Brisby    Caller callback #:  [phone]   Encourage patient to contact the pharmacy for refills or they can request refills through [facility]   LAST APPOINTMENT DATE:  11/04/2019   NEXT APPOINTMENT DATE:  11/05/20   MEDICATION NAME & DOSE: vyvanse 50 mg   Is the patient out of medication?  yes   PHARMACY:   [facility] [address] [place]    Let patient know to contact pharmacy at the end of the day to make sure medication is ready.   Please notify patient to allow 48-72 hours to process  LFD 07/23/20 #30 with no refills LOV 11/04/19 NOV 11/05/20

## 2020-08-21 NOTE — Telephone Encounter (Signed)
Caller name:  Gautam Langhorst   Caller callback #:  [phone]  Encourage patient to contact the pharmacy for refills or they can request refills through [facility]  LAST APPOINTMENT DATE:  11/04/2019  NEXT APPOINTMENT DATE:  11/05/20  MEDICATION NAME & DOSE: vyvanse 50 mg  Is the patient out of medication?  yes  PHARMACY:   [facility] [address] [place]   Let patient know to contact pharmacy at the end of the day to make sure medication is ready.  Please notify patient to allow 48-72 hours to process  (CLINICAL TO FILL OR ROUTE PER PROTOCOLS)

## 2020-08-21 NOTE — Telephone Encounter (Signed)
Sent to provider for approval

## 2020-08-26 ENCOUNTER — Other Ambulatory Visit: Payer: Self-pay | Admitting: Family Medicine

## 2020-09-20 ENCOUNTER — Telehealth: Payer: Self-pay | Admitting: Family Medicine

## 2020-09-20 NOTE — Telephone Encounter (Signed)
Requesting:Vyvanse 50mg  Contract: UDS: Last Visit:11/04/19 Next Visit:11/05/20 Last Refill:08/21/20 30 tabs 0 refills  Please Advise

## 2020-09-20 NOTE — Telephone Encounter (Signed)
Patient needs his [facility]se [facility] [address] [place],

## 2020-09-21 MED ORDER — LISDEXAMFETAMINE DIMESYLATE 50 MG PO CAPS: 50.0000 mg | ORAL_CAPSULE | Freq: Every day | ORAL | 0 refills | Status: DC

## 2020-09-21 NOTE — Telephone Encounter (Signed)
Prescription filled at pt request °

## 2020-09-24 ENCOUNTER — Telehealth (INDEPENDENT_AMBULATORY_CARE_PROVIDER_SITE_OTHER): Payer: 59 | Admitting: Family Medicine

## 2020-09-24 ENCOUNTER — Other Ambulatory Visit: Payer: Self-pay

## 2020-09-24 DIAGNOSIS — J069 Acute upper respiratory infection, unspecified: Secondary | ICD-10-CM | POA: Diagnosis not present

## 2020-09-24 NOTE — Progress Notes (Signed)
Patient ID: Raymond Santana, male   DOB: 1990-01-16, 30 y.o.   MRN: [id number]  This visit type was conducted due to national recommendations for restrictions regarding the COVID-19 pandemic in an effort to limit this patient's exposure and mitigate transmission in our community.   Virtual Visit via Telephone Note  I connected with Raymond Santana on 09/24/20 at  3:45 PM EDT by telephone and verified that I am speaking with the correct person using two identifiers.   I discussed the limitations, risks, security and privacy concerns of performing an evaluation and management service by telephone and the availability of in person appointments. I also discussed with the patient that there may be a patient responsible charge related to this service. The patient expressed understanding and agreed to proceed.  Location patient: home Location provider: work or home office Participants present for the call: patient, provider Patient did not have a visit in the prior 7 days to address this/these issue(s).   History of Present Illness: Raymond Santana relates onset about a week ago of some nasal congestion, sore throat, cough, headache.  No fever.  Wife had similar symptoms.  No dyspnea.  No nausea, vomiting, or diarrhea.  His 41 year old son also has very similar symptoms.  They initially thought these were allergies but now more suspicion of infection.  COVID testing has been negative.  Raymond Santana does have history of asthma but does not feel like he has had any significant wheezing recently.  Past Medical History:  Diagnosis Date   Asthma    Hemorrhoid 2017   Hypertension    Pilonidal cyst    Past Surgical History:  Procedure Laterality Date   APPENDECTOMY     CYSTECTOMY     Buttock   Staph infection     L toe   WISDOM TOOTH EXTRACTION Bilateral     reports that he has never smoked. He has never used smokeless tobacco. He reports current alcohol use. He reports that he does not use drugs. family  history includes Barrett's esophagus in his father; Colon cancer in his paternal grandmother; Diabetes in his mother and another family member; Hypertension in his mother. No Known Allergies    Observations/Objective: Patient sounds cheerful and well on the phone. I do not appreciate any SOB. Speech and thought processing are grossly intact. Patient reported vitals:  Assessment and Plan:  Probable viral URI with cough.  Symptoms are relatively stable.  -Treat symptomatically with over-the-counter medications as needed and plenty of fluids and rest.  Follow Up Instructions:  -Follow-up for any fever, dyspnea, or other concerns.   [id number] 5-10 [id number] 11-20 [id number] 21-30 I did not refer this patient for an OV in the next 24 hours for this/these issue(s).  I discussed the assessment and treatment plan with the patient. The patient was provided an opportunity to ask questions and all were answered. The patient agreed with the plan and demonstrated an understanding of the instructions.   The patient was advised to call back or seek an in-person evaluation if the symptoms worsen or if the condition fails to improve as anticipated.  I provided 11 minutes of non-face-to-face time during this encounter.   Evelena Peat, MD

## 2020-09-30 ENCOUNTER — Other Ambulatory Visit: Payer: Self-pay

## 2020-09-30 ENCOUNTER — Encounter: Payer: Self-pay | Admitting: Emergency Medicine

## 2020-09-30 ENCOUNTER — Ambulatory Visit
Admission: EM | Admit: 2020-09-30 | Discharge: 2020-09-30 | Disposition: A | Discharge status: Home / Self Care | Payer: 59 | Attending: Internal Medicine | Admitting: Internal Medicine

## 2020-09-30 DIAGNOSIS — Z20822 Contact with and (suspected) exposure to covid-19: Secondary | ICD-10-CM | POA: Diagnosis not present

## 2020-09-30 DIAGNOSIS — J029 Acute pharyngitis, unspecified: Secondary | ICD-10-CM | POA: Diagnosis present

## 2020-09-30 DIAGNOSIS — J069 Acute upper respiratory infection, unspecified: Secondary | ICD-10-CM | POA: Insufficient documentation

## 2020-09-30 LAB — POCT MONO SCREEN (KUC): Mono, POC: NEGATIVE

## 2020-09-30 LAB — POCT RAPID STREP A (OFFICE): Rapid Strep A Screen: NEGATIVE

## 2020-09-30 MED ORDER — AMOXICILLIN 875 MG PO TABS: 875.0000 mg | ORAL_TABLET | Freq: Two times a day (BID) | ORAL | 0 refills | Status: AC

## 2020-09-30 NOTE — ED Provider Notes (Signed)
[name]    CSN: [id number] Arrival date & time: 09/30/20  4163      History   Chief Complaint Chief Complaint  Patient presents with   Sore Throat    HPI Raymond Santana is a 30 y.o. male.   Patient presents with sore throat, nasal congestion, left ear pain that has been present for approximately 2 weeks.  Patient reports that he has noticed "bleeding in his throat at times".  Denies any cough, fever, chest pain, shortness of breath.  Wife and child have similar symptoms.  Has been using over-the-counter cough and cold medications with minimal improvement in symptoms.   Sore Throat   Past Medical History:  Diagnosis Date   Asthma    Hemorrhoid 2017   Hypertension    Pilonidal cyst     Patient Active Problem List   Diagnosis Date Noted   ADHD (attention deficit hyperactivity disorder), combined type 10/21/2018   Social anxiety disorder 10/21/2018   Persistent depressive disorder 10/21/2018   Binge eating 10/21/2018   Morbid obesity (HCC) 04/08/2017   Bleeding hemorrhoid 08/09/2015   Physical exam 02/02/2015   Wheezing 09/15/2013   Lumbar back pain 02/18/2013    Past Surgical History:  Procedure Laterality Date   APPENDECTOMY     CYSTECTOMY     Buttock   Staph infection     L toe   WISDOM TOOTH EXTRACTION Bilateral        Home Medications    Prior to Admission medications   Medication Sig Start Date End Date Taking? Authorizing Provider  amoxicillin (AMOXIL) 875 MG tablet Take 1 tablet (875 mg total) by mouth 2 (two) times daily for 10 days. 09/30/20 10/10/20 Yes Lance Muss, FNP  albuterol (VENTOLIN HFA) 108 (90 Base) MCG/ACT inhaler INHALE TWO PUFFS BY MOUTH EVERY 6 HOURS AS NEEDED FOR  WHEEZING  OR  SHORTNESS  OF  BREATH 06/05/20   Sheliah Hatch, MD  buPROPion (WELLBUTRIN) 75 MG tablet TAKE 1 TABLET BY MOUTH TWICE A DAY 12/27/18   Sheliah Hatch, MD  fenofibrate 160 MG tablet TAKE 1 TABLET BY MOUTH  DAILY 12/01/18    Sheliah Hatch, MD  Fexofenadine HCl (ALLEGRA PO) Take 1 tablet by mouth daily.    [provider]  lidocaine (XYLOCAINE) 2 % solution 5-15 mL gurgle as needed 11/26/18   Cathie Hoops, Amy V, PA-C  lisdexamfetamine (VYVANSE) 50 MG capsule Take 1 capsule (50 mg total) by mouth daily. 09/21/20   Sheliah Hatch, MD  pantoprazole (PROTONIX) 40 MG tablet TAKE 1 TABLET BY MOUTH  DAILY 08/27/20   Sheliah Hatch, MD    Family History Family History  Problem Relation Age of Onset   Diabetes Mother        mother's side of family   Hypertension Mother        mother's side of family   Barrett's esophagus Father    Diabetes Other        father's side of family, Paternal [place] aunt   Colon cancer Paternal Grandmother     Social History Social History   Tobacco Use   Smoking status: Never   Smokeless tobacco: Never  Vaping Use   Vaping Use: Never used  Substance Use Topics   Alcohol use: Yes    Comment: ocassionally   Drug use: No     Allergies   Patient has no known allergies.   Review of Systems Review of Systems Per HPI  Physical Exam Triage Vital Signs ED Triage Vitals  Enc Vitals Group     BP 09/30/20 0939 (!) 145/87     Pulse Rate 09/30/20 0939 92     Resp 09/30/20 0939 16     Temp 09/30/20 0939 97.6 F (36.4 C)     Temp Source 09/30/20 0939 Oral     SpO2 09/30/20 0939 95 %     Weight --      Height --      Head Circumference --      Peak Flow --      Pain Score 09/30/20 0940 8     Pain Loc --      Pain Edu? --      Excl. in GC? --    No data found.  Updated Vital Signs BP (!) 145/87 (BP Location: Left Arm)   Pulse 92   Temp 97.6 F (36.4 C) (Oral)   Resp 16   SpO2 95%   Visual Acuity Right Eye Distance:   Left Eye Distance:   Bilateral Distance:    Right Eye Near:   Left Eye Near:    Bilateral Near:     Physical Exam Constitutional:      General: He is not in acute distress.    Appearance: Normal appearance.  HENT:     Head:  Normocephalic and atraumatic.     Right Ear: Ear canal normal. A middle ear effusion is present. Tympanic membrane is not erythematous or bulging.     Left Ear: Ear canal normal. A middle ear effusion is present. Tympanic membrane is not erythematous or bulging.     Nose: Congestion present.     Mouth/Throat:     Mouth: Mucous membranes are moist.     Pharynx: Posterior oropharyngeal erythema present. No oropharyngeal exudate.     Tonsils: No tonsillar exudate or tonsillar abscesses. 0 on the right. 0 on the left.  Eyes:     Extraocular Movements: Extraocular movements intact.     Conjunctiva/sclera: Conjunctivae normal.     Pupils: Pupils are equal, round, and reactive to light.  Cardiovascular:     Rate and Rhythm: Normal rate and regular rhythm.     Pulses: Normal pulses.     Heart sounds: Normal heart sounds.  Pulmonary:     Effort: Pulmonary effort is normal. No respiratory distress.     Breath sounds: Normal breath sounds. No wheezing.  Abdominal:     General: Abdomen is flat. Bowel sounds are normal.     Palpations: Abdomen is soft.  Musculoskeletal:        General: Normal range of motion.     Cervical back: Normal range of motion.  Skin:    General: Skin is warm and dry.  Neurological:     General: No focal deficit present.     Mental Status: He is alert and oriented to person, place, and time. Mental status is at baseline.  Psychiatric:        Mood and Affect: Mood normal.        Behavior: Behavior normal.     UC Treatments / Results  Labs (all labs ordered are listed, but only abnormal results are displayed) Labs Reviewed  CULTURE, GROUP A STREP ([facility])  NOVEL CORONAVIRUS, NAA  POCT RAPID STREP A (OFFICE)  POCT MONO SCREEN ([facility])    EKG   Radiology No results found.  Procedures Procedures (including critical care time)  Medications Ordered in UC Medications - No data  to display  Initial Impression / Assessment and Plan / UC Course  I have reviewed  the triage vital signs and the nursing notes.  Pertinent labs & imaging results that were available during my care of the patient were reviewed by me and considered in my medical decision making (see chart for details).     Rapid strep test is negative but still suspicious of strep throat due to appearance of posterior pharynx on exam.  No concerns for peritonsillar abscess.  Will treat with amoxicillin x10 days.  Throat culture is pending.  COVID-19 viral swab is pending.  Rapid monotest was negative.  Discussed over-the-counter medications to help alleviate symptoms with patient as well.Discussed strict return precautions. Patient verbalized understanding and is agreeable with plan.  Final Clinical Impressions(s) / UC Diagnoses   Final diagnoses:  Sore throat  Acute upper respiratory infection  Encounter for laboratory testing for COVID-19 virus     Discharge Instructions      Your rapid strep test was negative but still suspicious of strep throat.  We will treat with amoxicillin to treat this.  Throat culture and COVID-19 viral swab are pending.  Your monotest was also negative.  You may use Cepacol throat lozenges over-the-counter to help alleviate sore throat as well.     ED Prescriptions     Medication Sig Dispense Auth. Provider   amoxicillin (AMOXIL) 875 MG tablet Take 1 tablet (875 mg total) by mouth 2 (two) times daily for 10 days. 20 tablet Lance Muss, FNP      PDMP not reviewed this encounter.   Lance Muss, FNP 09/30/20 1047

## 2020-09-30 NOTE — Discharge Instructions (Addendum)
Your rapid strep test was negative but still suspicious of strep throat.  We will treat with amoxicillin to treat this.  Throat culture and COVID-19 viral swab are pending.  Your monotest was also negative.  You may use Cepacol throat lozenges over-the-counter to help alleviate sore throat as well.

## 2020-09-30 NOTE — ED Triage Notes (Addendum)
Sore throat x 2 weeks, "so bad where it's bled, sometimes it feels like my throat is closing up, the mucus I coughed up has blood specs occasionally." Treating with OTC cold meds with mild improvement. Also states his left ear has begun to bother him

## 2020-10-01 LAB — NOVEL CORONAVIRUS, NAA: SARS-CoV-2, NAA: NOT DETECTED

## 2020-10-01 LAB — SARS-COV-2, NAA 2 DAY TAT

## 2020-10-03 LAB — CULTURE, GROUP A STREP (THRC)

## 2020-10-19 ENCOUNTER — Other Ambulatory Visit: Payer: Self-pay | Admitting: Family Medicine

## 2020-10-19 MED ORDER — LISDEXAMFETAMINE DIMESYLATE 50 MG PO CAPS: 50.0000 mg | ORAL_CAPSULE | Freq: Every day | ORAL | 0 refills | Status: DC

## 2020-10-19 NOTE — Telephone Encounter (Signed)
Requesting:Vyvanse 50mg  Contract: UDS: Last Visit:11/04/19 Next Visit:11/05/20 Last Refill:09/21/20 30 tabs 0 refills  Please Advise

## 2020-10-19 NOTE — Telephone Encounter (Signed)
..  Caller name:  Shmuel Girgis  Caller callback [phone]  Encourage patient to contact the pharmacy for refills or they can request refills through [facility]  LAST APPOINTMENT DATE:  (Please schedule appointment if longer than 1 year)  NEXT APPOINTMENT DATE: 11/05/2020  MEDICATION NAME & DOSE:  Vyvanse 50 mg  Is the patient out of medication? YES/NO: Yes   PHARMACY:  [facility] [address], [place]  Let patient know to contact pharmacy at the end of the day to make sure medication is ready.  Please notify patient to allow 48-72 hours to process  (CLINICAL TO FILL OR ROUTE PER PROTOCOLS)

## 2020-11-05 ENCOUNTER — Other Ambulatory Visit: Payer: Self-pay

## 2020-11-05 ENCOUNTER — Ambulatory Visit (INDEPENDENT_AMBULATORY_CARE_PROVIDER_SITE_OTHER): Payer: 59 | Admitting: Family Medicine

## 2020-11-05 ENCOUNTER — Encounter: Payer: Self-pay | Admitting: Family Medicine

## 2020-11-05 VITALS — BP 138/100 | HR 105 | Temp 99.1°F | Resp 16 | Ht 72.0 in | Wt 342.8 lb

## 2020-11-05 DIAGNOSIS — Z0000 Encounter for general adult medical examination without abnormal findings: Secondary | ICD-10-CM | POA: Diagnosis not present

## 2020-11-05 DIAGNOSIS — G4730 Sleep apnea, unspecified: Secondary | ICD-10-CM

## 2020-11-05 DIAGNOSIS — F419 Anxiety disorder, unspecified: Secondary | ICD-10-CM | POA: Diagnosis not present

## 2020-11-05 DIAGNOSIS — Z23 Encounter for immunization: Secondary | ICD-10-CM | POA: Diagnosis not present

## 2020-11-05 MED ORDER — BUPROPION HCL 75 MG PO TABS: 75.0000 mg | ORAL_TABLET | Freq: Two times a day (BID) | ORAL | 3 refills | Status: DC

## 2020-11-05 NOTE — Assessment & Plan Note (Signed)
Deteriorated.  Pt has a lot of situational stressors at this time.  Restart Wellbutrin and refer to counseling.  Pt expressed understanding and is in agreement w/ plan.

## 2020-11-05 NOTE — Patient Instructions (Addendum)
Follow up in 4-6 weeks to recheck mood We'll notify you of your lab results and make any changes if needed Continue to work on healthy diet and regular exercise- you can do it! RESTART the Wellbutrin twice daily We'll call you to set up counseling Try and watch the elbow position to avoid numbness/weakness Call with any questions or concerns Hang in there!!!

## 2020-11-05 NOTE — Assessment & Plan Note (Signed)
Deteriorated.  Pt has had 20 lb weight gain since last visit.  Increased emotional eating w/ recent stressors.  Encouraged healthy diet and regular exercise.  Check labs to risk stratify.  Will follow.

## 2020-11-05 NOTE — Assessment & Plan Note (Signed)
Pt's PE WNL w/ exception of obesity.  Tdap and flu given as baby is on the way.  Check labs.  Anticipatory guidance provided.

## 2020-11-05 NOTE — Progress Notes (Signed)
   Subjective:    Patient ID: Raymond Santana, male    DOB: March 29, 1990, 30 y.o.   MRN: [id number]  HPI CPE- pt to get Tdap and flu today.  + 20 lb weight gain- father in law passed away, pt had falling out w/ his family, increased work stress. Is again emotionally eating.  Review of Systems Patient reports no vision/hearing changes, anorexia, fever ,adenopathy, persistant/recurrent hoarseness, swallowing issues, chest pain, palpitations, edema, persistant/recurrent cough, hemoptysis, dyspnea (rest,exertional, paroxysmal nocturnal), gastrointestinal  bleeding (melena, rectal bleeding), abdominal pain, excessive heart burn, GU symptoms (dysuria, hematuria, voiding/incontinence issues) syncope, focal weakness, memory loss, skin/hair/nail changes, abnormal bruising/bleeding, musculoskeletal symptoms/signs.   + anxiety- increased irritability, trying to handle a lot of things.  'i feel like i'm being crushed' + bilateral ulnar numbness, L>R  This visit occurred during the SARS-CoV-2 public health emergency.  Safety protocols were in place, including screening questions prior to the visit, additional usage of staff PPE, and extensive cleaning of exam room while observing appropriate contact time as indicated for disinfecting solutions.      Objective:   Physical Exam General Appearance:    Alert, cooperative, no distress, appears stated age, obese  Head:    Normocephalic, without obvious abnormality, atraumatic  Eyes:    PERRL, conjunctiva/corneas clear, EOM's intact, fundi    benign, both eyes       Ears:    Normal TM's and external ear canals, both ears  Nose:   Deferred due to COVID  Throat:   Neck:   Supple, symmetrical, trachea midline, no adenopathy;       thyroid:  No enlargement/tenderness/nodules  Back:     Symmetric, no curvature, ROM normal, no CVA tenderness  Lungs:     Clear to auscultation bilaterally, respirations unlabored  Chest wall:    No tenderness or deformity  Heart:     Regular rate and rhythm, S1 and S2 normal, no murmur, rub   or gallop  Abdomen:     Soft, non-tender, bowel sounds active all four quadrants,    no masses, no organomegaly  Genitalia:    Deferred  Rectal:    Extremities:   Extremities normal, atraumatic, no cyanosis or edema  Pulses:   2+ and symmetric all extremities  Skin:   Skin color, texture, turgor normal, no rashes or lesions  Lymph nodes:   Cervical, supraclavicular, and axillary nodes normal  Neurologic:   CNII-XII intact. Normal strength, sensation and reflexes      throughout          Assessment & Plan:

## 2020-11-19 ENCOUNTER — Other Ambulatory Visit: Payer: Self-pay | Admitting: Family Medicine

## 2020-11-19 MED ORDER — LISDEXAMFETAMINE DIMESYLATE 50 MG PO CAPS: 50.0000 mg | ORAL_CAPSULE | Freq: Every day | ORAL | 0 refills | Status: DC

## 2020-12-10 ENCOUNTER — Ambulatory Visit (INDEPENDENT_AMBULATORY_CARE_PROVIDER_SITE_OTHER): Payer: 59 | Admitting: Family Medicine

## 2020-12-10 ENCOUNTER — Other Ambulatory Visit: Payer: Self-pay

## 2020-12-10 ENCOUNTER — Encounter: Payer: Self-pay | Admitting: Family Medicine

## 2020-12-10 DIAGNOSIS — F419 Anxiety disorder, unspecified: Secondary | ICD-10-CM

## 2020-12-10 DIAGNOSIS — R632 Polyphagia: Secondary | ICD-10-CM

## 2020-12-10 MED ORDER — BUPROPION HCL ER (XL) 150 MG PO TB24: 150.0000 mg | ORAL_TABLET | Freq: Every day | ORAL | 3 refills | Status: DC

## 2020-12-10 NOTE — Progress Notes (Signed)
   Virtual Visit via Video   I connected with patient on 12/10/20 at  1:00 PM EST by a video enabled telemedicine application and verified that I am speaking with the correct person using two identifiers.  Location patient: Home Location provider: [place] [facility], Office Persons participating in the virtual visit: Patient, Provider, CMA Tresa Endo C)  I discussed the limitations of evaluation and management by telemedicine and the availability of in person appointments. The patient expressed understanding and agreed to proceed.  Subjective:   HPI:   Depression/Anxiety- sxs were quite severe at last visit.  We restarted his Wellbutrin at 75mg  BID.  PHQ9 improved from 10-->6.  'i was actually doing pretty great'.  Much less anger, less emotional eating.  Some increased restlessness- 'i can't turn my brain off'.  Will wake him.  Binge eating disorder- ongoing issue for pt.  At last visit had gained 20 lbs due to stress eating.  Is currently on Vyvanse.  Reports emotional/binge eating has decreased.  Morbid obesity- weight at last visit was 342.  Does not have an accurate weight for today.  ROS:   See pertinent positives and negatives per HPI.  Patient Active Problem List   Diagnosis Date Noted   ADHD (attention deficit hyperactivity disorder), combined type 10/21/2018   Anxiety 10/21/2018   Persistent depressive disorder 10/21/2018   Binge eating 10/21/2018   Morbid obesity (HCC) 04/08/2017   Bleeding hemorrhoid 08/09/2015   Physical exam 02/02/2015   Wheezing 09/15/2013   Lumbar back pain 02/18/2013    Social History   Tobacco Use   Smoking status: Never   Smokeless tobacco: Never  Substance Use Topics   Alcohol use: Yes    Comment: ocassionally    Current Outpatient Medications:    albuterol (VENTOLIN HFA) 108 (90 Base) MCG/ACT inhaler, INHALE TWO PUFFS BY MOUTH EVERY 6 HOURS AS NEEDED FOR  WHEEZING  OR  SHORTNESS  OF  BREATH, Disp: 8 g, Rfl: 1   buPROPion  (WELLBUTRIN) 75 MG tablet, Take 1 tablet (75 mg total) by mouth 2 (two) times daily., Disp: 60 tablet, Rfl: 3   levocetirizine (XYZAL) 5 MG tablet, Take 5 mg by mouth every evening., Disp: , Rfl:    lisdexamfetamine (VYVANSE) 50 MG capsule, Take 1 capsule (50 mg total) by mouth daily., Disp: 30 capsule, Rfl: 0   pantoprazole (PROTONIX) 40 MG tablet, TAKE 1 TABLET BY MOUTH  DAILY, Disp: 90 tablet, Rfl: 1  No Known Allergies  Objective:   There were no vitals taken for this visit. AAOx3, NAD NCAT, EOMI No obvious CN deficits Pt is able to speak clearly, coherently without shortness of breath or increased work of breathing.  Thought process is linear.  Mood is appropriate.   Assessment and Plan:   Anxiety/Depression- improved w/ restarting Wellbutrin.  He continues to have racing thoughts at night and feel restless.  Will increase Wellbutrin to 150mg  daily and monitor for improvement.  Binge eating- currently on Vyvanse to treat both ADHD and binge eating.  States this has improved since adding Wellbutrin.  Will continue to follow.  Morbid obesity- ongoing issue for pt.  He reports he has decreased his emotional/binge eating and is working on resuming healthy habits.  Will follow.   02/20/2013, MD 12/10/2020

## 2020-12-21 ENCOUNTER — Other Ambulatory Visit: Payer: Self-pay | Admitting: Family Medicine

## 2020-12-21 ENCOUNTER — Telehealth: Payer: Self-pay

## 2020-12-21 MED ORDER — LISDEXAMFETAMINE DIMESYLATE 50 MG PO CAPS: 50.0000 mg | ORAL_CAPSULE | Freq: Every day | ORAL | 0 refills | Status: DC

## 2020-12-21 MED ORDER — BUPROPION HCL ER (XL) 150 MG PO TB24: 150.0000 mg | ORAL_TABLET | Freq: Every day | ORAL | 1 refills | Status: DC

## 2020-12-21 NOTE — Telephone Encounter (Signed)
Caller name:Dontavious Castile   Caller callback [phone]  Encourage patient to contact the pharmacy for refills or they can request refills through [facility]  (Please schedule appointment if patient has not been seen in over a year)  MEDICATION NAME & DOSE:buPROPion (WELLBUTRIN XL) 150 MG 24 hr tablet   Notes/Comments from patient: this was submitted and they never picked it up and now it is put back on the shelf  so pt can not get until new RX is sent. Pt would like to change to Mail order Pharmacy [facility]   WHAT PHARMACY WOULD THEY LIKE THIS SENT TO: [facility] - [place], [place] - [address]  [address] [place] [address], [place]  [id number]   Please notify patient: It takes 48-72 hours to process rx refill requests Ask patient to call pharmacy to ensure rx is ready before heading there.   (CLINICAL TO FILL OR ROUTE PER PROTOCOLS)

## 2020-12-21 NOTE — Telephone Encounter (Signed)
Prescription sent to [facility] at pt's request

## 2021-01-23 ENCOUNTER — Other Ambulatory Visit: Payer: Self-pay | Admitting: Family Medicine

## 2021-01-23 MED ORDER — LISDEXAMFETAMINE DIMESYLATE 50 MG PO CAPS: 50.0000 mg | ORAL_CAPSULE | Freq: Every day | ORAL | 0 refills | Status: DC

## 2021-02-03 ENCOUNTER — Other Ambulatory Visit: Payer: Self-pay | Admitting: Family Medicine

## 2021-02-21 ENCOUNTER — Other Ambulatory Visit: Payer: Self-pay | Admitting: Family Medicine

## 2021-02-22 MED ORDER — LISDEXAMFETAMINE DIMESYLATE 50 MG PO CAPS: 50.0000 mg | ORAL_CAPSULE | Freq: Every day | ORAL | 0 refills | Status: DC

## 2021-02-26 ENCOUNTER — Ambulatory Visit (INDEPENDENT_AMBULATORY_CARE_PROVIDER_SITE_OTHER): Payer: 59 | Admitting: Pulmonary Disease

## 2021-02-26 ENCOUNTER — Encounter: Payer: Self-pay | Admitting: Pulmonary Disease

## 2021-02-26 ENCOUNTER — Other Ambulatory Visit: Payer: Self-pay

## 2021-02-26 VITALS — BP 110/72 | HR 102 | Temp 98.4°F | Ht 72.0 in | Wt 344.0 lb

## 2021-02-26 DIAGNOSIS — R0683 Snoring: Secondary | ICD-10-CM | POA: Diagnosis not present

## 2021-02-26 NOTE — Progress Notes (Signed)
[facility]  Chief Complaint  Patient presents with   Consult    He reports he has was told he stops breathing and snoring very loud.      Past Surgical History:  He  has a past surgical history that includes Appendectomy; Cystectomy; Staph infection; and Wisdom tooth extraction (Bilateral).  Past Medical History:  Asthma, Hypertension, Pilonidal cyst, Fatty liver, Depression, Anxiety, Bruxism  Constitutional:  BP 110/72 (BP Location: Left Arm, Patient Position: Sitting, Cuff Size: Normal)    Pulse (!) 102    Temp 98.4 F (36.9 C) (Oral)    Ht 6' (1.829 m)    Wt (!) 344 lb (156 kg)    SpO2 94%    BMI 46.65 kg/m   Brief Summary:  Raymond Santana is a 31 y.o. male with snoring.      Subjective:   He is here with his wife.  She has noticed his sleep has gotten worse over the past 1 year.  He is snoring more and makes funny breathing noises.  He stops breathing sometimes while asleep.  He is a restless sleeper.  He recently found out from his dentist that he has bruxism.  He feels tired during the day, and has trouble staying awake while watching TV or reading.  His mother had a stroke a few years ago and then found out she had sleep apnea.  He goes to sleep at 10 pm.  He falls asleep in 15 minutes.  He wakes up some times to use the bathroom.  He gets out of bed at 6 am.  He feels terrible in the morning.  He denies morning headache.  He does not use anything to help him fall sleep.  Uses energy drinks to help stay awake.  He denies sleep walking, sleep talking, or nightmares.  There is no history of restless legs.  He denies sleep hallucinations, sleep paralysis, or cataplexy.  The Epworth score is 12 out of 24.   Physical Exam:   Appearance - well kempt   ENMT - no sinus tenderness, no oral exudate, no LAN, Mallampati 3 airway, no stridor, 2+ tonsils  Respiratory - equal breath sounds bilaterally, no wheezing or rales  CV -  s1s2 regular rate and rhythm, no murmurs  Ext - no clubbing, no edema  Skin - no rashes  Psych - normal mood and affect   Sleep Tests:    Social History:  He  reports that he has never smoked. He has never used smokeless tobacco. He reports current alcohol use. He reports that he does not use drugs.  Family History:  His family history includes Barrett's esophagus in his father; Colon cancer in his paternal grandmother; Diabetes in his mother and another family member; Hypertension in his mother.    Discussion:  He has snoring, sleep disruption, apnea, and daytime sleepiness.  He has history of depression.  His BMI is > 35.  He has family history of sleep apnea.  I am concerned he could have obstructive sleep apnea.  Assessment/Plan:   Snoring with excessive daytime sleepiness. - will need to arrange for a home sleep study  Bruxism. - explained how this can overlap with sleep apnea   Obesity. - discussed how weight can impact sleep and risk for sleep disordered breathing - discussed options to assist with weight loss: combination of diet modification, cardiovascular and strength training exercises  Cardiovascular risk. - had an extensive discussion regarding the  adverse health consequences related to untreated sleep disordered breathing - specifically discussed the risks for hypertension, coronary artery disease, cardiac dysrhythmias, cerebrovascular disease, and diabetes - lifestyle modification discussed  Safe driving practices. - discussed how sleep disruption can increase risk of accidents, particularly when driving - safe driving practices were discussed  Therapies for obstructive sleep apnea. - if the sleep study shows significant sleep apnea, then various therapies for treatment were reviewed: CPAP, oral appliance, and surgical interventions  Time Spent Involved in Patient Care on Day of Examination:  35 minutes  Follow up:   Patient Instructions  Will  arrange for home sleep study Will call to arrange for follow up after sleep study reviewed   Medication List:   Allergies as of 02/26/2021   No Known Allergies      Medication List        Accurate as of February 26, 2021  3:23 PM. If you have any questions, ask your nurse or doctor.          albuterol 108 (90 Base) MCG/ACT inhaler Commonly known as: VENTOLIN HFA INHALE TWO PUFFS BY MOUTH EVERY 6 HOURS AS NEEDED FOR  WHEEZING  OR  SHORTNESS  OF  BREATH   buPROPion 150 MG 24 hr tablet Commonly known as: Wellbutrin XL Take 1 tablet (150 mg total) by mouth daily.   levocetirizine 5 MG tablet Commonly known as: XYZAL Take 5 mg by mouth every evening.   lisdexamfetamine 50 MG capsule Commonly known as: Vyvanse Take 1 capsule (50 mg total) by mouth daily.   pantoprazole 40 MG tablet Commonly known as: PROTONIX TAKE 1 TABLET BY MOUTH  DAILY        Signature:  Coralyn Helling, MD [facility] Pager - [phone] 02/26/2021, 3:23 PM

## 2021-02-26 NOTE — Patient Instructions (Signed)
Will arrange for home sleep study Will call to arrange for follow up after sleep study reviewed  

## 2021-03-06 HISTORY — PX: VASECTOMY: SHX75

## 2021-03-24 ENCOUNTER — Other Ambulatory Visit: Payer: Self-pay | Admitting: Family Medicine

## 2021-03-25 MED ORDER — LISDEXAMFETAMINE DIMESYLATE 50 MG PO CAPS: 50.0000 mg | ORAL_CAPSULE | Freq: Every day | ORAL | 0 refills | Status: DC

## 2021-04-09 ENCOUNTER — Ambulatory Visit: Payer: 59

## 2021-04-09 DIAGNOSIS — G4733 Obstructive sleep apnea (adult) (pediatric): Secondary | ICD-10-CM | POA: Diagnosis not present

## 2021-04-09 DIAGNOSIS — R0683 Snoring: Secondary | ICD-10-CM

## 2021-04-18 ENCOUNTER — Other Ambulatory Visit: Payer: Self-pay | Admitting: Family Medicine

## 2021-04-19 MED ORDER — LISDEXAMFETAMINE DIMESYLATE 50 MG PO CAPS: 50.0000 mg | ORAL_CAPSULE | Freq: Every day | ORAL | 0 refills | Status: DC

## 2021-04-19 NOTE — Telephone Encounter (Signed)
Patient is requesting a refill of the following medications: ?Requested Prescriptions  ? ?Pending Prescriptions Disp Refills  ? lisdexamfetamine (VYVANSE) 50 MG capsule 30 capsule 0  ?  Sig: Take 1 capsule (50 mg total) by mouth daily.  ? ? ?Date of patient request: 04/18/2021 ?Last office visit: 12/10/2020 ?Date of last refill: 03/25/2021 ?Last refill amount: 30 capsules  ?Follow up time period per chart:  ? ?

## 2021-04-22 ENCOUNTER — Telehealth: Payer: Self-pay | Admitting: Pulmonary Disease

## 2021-04-22 DIAGNOSIS — G4733 Obstructive sleep apnea (adult) (pediatric): Secondary | ICD-10-CM | POA: Diagnosis not present

## 2021-04-22 NOTE — Telephone Encounter (Signed)
Called and gave results ot patient. He voiced understanding. Scheduled an appt with [facility] NP in [place] for next Tuesday. Nothing further needed ?

## 2021-04-22 NOTE — Telephone Encounter (Signed)
HST 04/09/21 >> AHI 13.2, SpO2 low 74% ? ?Please inform him that his sleep study shows mild obstructive sleep apnea.  Please arrange for ROV with me or NP to discuss treatment options. ? ? ? ?

## 2021-04-30 ENCOUNTER — Encounter: Payer: Self-pay | Admitting: Nurse Practitioner

## 2021-04-30 ENCOUNTER — Ambulatory Visit: Payer: 59 | Admitting: Nurse Practitioner

## 2021-04-30 VITALS — BP 122/76 | HR 86 | Temp 98.3°F | Ht 72.0 in | Wt 342.0 lb

## 2021-04-30 DIAGNOSIS — G4733 Obstructive sleep apnea (adult) (pediatric): Secondary | ICD-10-CM | POA: Diagnosis not present

## 2021-04-30 NOTE — Progress Notes (Signed)
Reviewed and agree with assessment/plan. ? ? ?Chesley Mires, MD ?[place] ?04/30/2021, 11:11 AM ?Pager:  [phone] ? ?

## 2021-04-30 NOTE — Assessment & Plan Note (Signed)
Mild obstructive sleep apnea with AHI 13.2. We discussed how untreated sleep apnea puts an individual at risk for cardiac arrhthymias, pulm HTN, DM, stroke and increases their risk for daytime accidents. We also briefly reviewed treatment options including weight loss, side sleeping position, oral appliance, CPAP therapy or referral to ENT for possible surgical options. Open to trial of CPAP therapy -will send ordered for auto CPAP 5 to 15 cm of water with mask of choice. ? ?Patient Instructions  ?Start CPAP auto 5-15 cmH2O every night, minimum of 4-6 hours a night.  ?Change equipment every 30 days or as directed by DME. Wash your tubing with warm soap and water daily, hang to dry. Wash humidifier portion weekly.  ?Be aware of reduced alertness and do not drive or operate heavy machinery if experiencing this or drowsiness.  ?Exercise encouraged, as tolerated. ?Healthy weight management discussed.  ?Avoid or decrease alcohol consumption and medications that make you more sleepy, if possible. ?Notify if persistent daytime sleepiness occurs even with consistent use of CPAP. ? ?Follow up in 31-90 days after starting on CPAP therapy with Dr. Craige Cotta or Katie Natalyah Cummiskey,NP. If symptoms do not improve or worsen, please contact office for sooner follow up or seek emergency care. ? ?

## 2021-04-30 NOTE — Patient Instructions (Signed)
Start CPAP auto 5-15 cmH2O every night, minimum of 4-6 hours a night.  ?Change equipment every 30 days or as directed by DME. Wash your tubing with warm soap and water daily, hang to dry. Wash humidifier portion weekly.  ?Be aware of reduced alertness and do not drive or operate heavy machinery if experiencing this or drowsiness.  ?Exercise encouraged, as tolerated. ?Healthy weight management discussed.  ?Avoid or decrease alcohol consumption and medications that make you more sleepy, if possible. ?Notify if persistent daytime sleepiness occurs even with consistent use of CPAP. ? ?Follow up in 31-90 days after starting on CPAP therapy with Dr. Craige Cotta or Katie Fran Mcree,NP. If symptoms do not improve or worsen, please contact office for sooner follow up or seek emergency care. ?

## 2021-04-30 NOTE — Progress Notes (Signed)
? ?@Patient  ID: , male    DOB: 04/10/1990, 31 y.o.   MRN: [id number] ? ?Chief Complaint  ?Patient presents with  ? Follow-up  ?  He is here to go over HST results.   ? ? ?Referring provider: ?[id number], MD ? ?HPI: ?31 year old male, never smoker followed for OSA. He is a patient of Dr. 34 and last seen in office for sleep consult on 31 year old male. Past medical history significant for obesity, ADHD, anxiety, persistent depressive disorder, allergic rhinitis. ? ?TEST/EVENTS:  ?04/09/2021 HST: AHI 13.2, SpO2 low 74%; average 94% ? ?02/26/2021: OV with Dr. 02/28/2021 for sleep consult. Reports snoring more and funny breathing at night per his wife. Recently found out from dentist he has bruxism. Reports getting around 8 hours of sleep a night; doesn't usually wake aside from occasionally needing to use the restroom. Wakes feeling terrible. Uses energy drinks to stay awake during the day. HST ordered for further eval.  ? ?04/30/2021: Today - follow up ?Patient presents today for follow-up after home sleep study.  Home sleep study showed mild obstructive sleep apnea with AHI 13.2.  He continues to report snoring at night and daytime fatigue symptoms.  Wakes in the morning feeling like he did not rest well at all.  They do have a 42-month-old at home; reports that she has began to give them 6-hour stretches which helps some.  He denies morning headache or drowsy driving.  No history of sleepwalking, sleep talking or nightmares.  Denies sleep loose [place], sleep paralysis or cataplexy as well.  Drinks energy drinks throughout the day to help him stay awake. ? ?No Known Allergies ? ?Immunization History  ?Administered Date(s) Administered  ? Influenza,inj,Quad PF,6+ Mos 11/05/2020  ? PFIZER(Purple Top)SARS-COV-2 Vaccination 03/21/1999, 04/11/2019, 10/29/2019  ? Tdap 10/07/2010, 11/05/2020  ? ? ?Past Medical History:  ?Diagnosis Date  ? ADHD   ? Asthma   ? Bruxism   ? Depression with anxiety   ? Fatty liver    ? Hemorrhoid 2017  ? Hypertension   ? Pilonidal cyst   ? ? ?Tobacco History: ?Social History  ? ?Tobacco Use  ?Smoking Status Never  ?Smokeless Tobacco Never  ? ?Counseling given: Not Answered ? ? ?Outpatient Medications Prior to Visit  ?Medication Sig Dispense Refill  ? albuterol (VENTOLIN HFA) 108 (90 Base) MCG/ACT inhaler INHALE TWO PUFFS BY MOUTH EVERY 6 HOURS AS NEEDED FOR  WHEEZING  OR  SHORTNESS  OF  BREATH 8 g 1  ? buPROPion (WELLBUTRIN XL) 150 MG 24 hr tablet Take 1 tablet (150 mg total) by mouth daily. 90 tablet 1  ? levocetirizine (XYZAL) 5 MG tablet Take 5 mg by mouth every evening.    ? lisdexamfetamine (VYVANSE) 50 MG capsule Take 1 capsule (50 mg total) by mouth daily. 30 capsule 0  ? pantoprazole (PROTONIX) 40 MG tablet TAKE 1 TABLET BY MOUTH  DAILY 90 tablet 3  ? ?No facility-administered medications prior to visit.  ? ? ? ?Review of Systems:  ? ?Constitutional: No weight loss or gain, night sweats, fevers, chills. +fatigue ?HEENT: No headaches, difficulty swallowing, tooth/dental problems, or sore throat. No sneezing, itching, ear ache, nasal congestion, or post nasal drip. +snoring ?CV:  No chest pain, orthopnea, PND, swelling in lower extremities, anasarca, dizziness, palpitations, syncope ?Resp: No shortness of breath with exertion or at rest. No excess mucus or change in color of mucus. No productive or non-productive. No hemoptysis. No wheezing.  No chest wall deformity ?  Skin: No rash, lesions, ulcerations ?MSK:  No joint pain or swelling.  No decreased range of motion.  No back pain. ?Neuro: No dizziness or lightheadedness.  ?Psych: No depression or anxiety. Mood stable.  ? ? ? ?Physical Exam: ? ?BP 122/76 (BP Location: Left Arm, Patient Position: Sitting, Cuff Size: Large)   Pulse 86   Temp 98.3 ?F (36.8 ?C) (Oral)   Ht 6' (1.829 m)   Wt (!) 342 lb (155.1 kg)   SpO2 98%   BMI 46.38 kg/m?  ? ?GEN: Pleasant, interactive, well-appearing; morbidly obese; in no acute distress. ?HEENT:   Normocephalic and atraumatic. PERRLA. Sclera white. Nasal turbinates pink, moist and patent bilaterally. No rhinorrhea present. Oropharynx pink and moist, without exudate or edema. No lesions, ulcerations, or postnasal drip.  ?NECK:  Supple w/ fair ROM. No JVD present. Normal carotid impulses w/o bruits. Thyroid symmetrical with no goiter or nodules palpated. No lymphadenopathy.   ?CV: RRR, no m/r/g, no peripheral edema. Pulses intact, +2 bilaterally. No cyanosis, pallor or clubbing. ?PULMONARY:  Unlabored, regular breathing. Clear bilaterally A&P w/o wheezes/rales/rhonchi. No accessory muscle use. No dullness to percussion. ?GI: BS present and normoactive. Soft, non-tender to palpation.  ?MSK: No erythema, warmth or tenderness. Cap refil <2 sec all extrem. No deformities or joint swelling noted.  ?Neuro: A/Ox3. No focal deficits noted.   ?Skin: Warm, no lesions or rashe ?Psych: Normal affect and behavior. Judgement and thought content appropriate.  ? ? ? ?Lab Results: ? ?CBC ?   ?Component Value Date/Time  ? WBC 10.1 11/04/2019 1319  ? RBC 6.17 (H) 11/04/2019 1319  ? HGB 16.7 11/04/2019 1319  ? HCT 50.2 11/04/2019 1319  ? PLT 352.0 11/04/2019 1319  ? MCV 81.4 11/04/2019 1319  ? MCH 28.1 04/08/2017 1611  ? MCHC 33.3 11/04/2019 1319  ? RDW 13.8 11/04/2019 1319  ? LYMPHSABS 3.1 11/04/2019 1319  ? MONOABS 0.8 11/04/2019 1319  ? EOSABS 0.2 11/04/2019 1319  ? BASOSABS 0.0 11/04/2019 1319  ? ? ?BMET ?   ?Component Value Date/Time  ? NA 137 11/04/2019 1319  ? K 4.2 11/04/2019 1319  ? CL 100 11/04/2019 1319  ? CO2 27 11/04/2019 1319  ? GLUCOSE 70 11/04/2019 1319  ? BUN 15 11/04/2019 1319  ? CREATININE 1.02 11/04/2019 1319  ? CREATININE 0.92 04/08/2017 1611  ? CALCIUM 10.0 11/04/2019 1319  ? GFRNONAA >60 07/11/2014 1425  ? GFRAA >60 07/11/2014 1425  ? ? ?BNP ?No results found for: BNP ? ? ?Imaging: ? ?No results found. ? ? ? ?   ? View : No data to display.  ?  ?  ?  ? ? ?No results found for:  NITRICOXIDE ? ? ? ? ? ?Assessment & Plan:  ? ?Mild obstructive sleep apnea ?Mild obstructive sleep apnea with AHI 13.2. We discussed how untreated sleep apnea puts an individual at risk for cardiac arrhthymias, pulm HTN, DM, stroke and increases their risk for daytime accidents. We also briefly reviewed treatment options including weight loss, side sleeping position, oral appliance, CPAP therapy or referral to ENT for possible surgical options. Open to trial of CPAP therapy -will send ordered for auto CPAP 5 to 15 cm of water with mask of choice. ? ?Patient Instructions  ?Start CPAP auto 5-15 cmH2O every night, minimum of 4-6 hours a night.  ?Change equipment every 30 days or as directed by DME. Wash your tubing with warm soap and water daily, hang to dry. Wash humidifier portion weekly.  ?Be aware  of reduced alertness and do not drive or operate heavy machinery if experiencing this or drowsiness.  ?Exercise encouraged, as tolerated. ?Healthy weight management discussed.  ?Avoid or decrease alcohol consumption and medications that make you more sleepy, if possible. ?Notify if persistent daytime sleepiness occurs even with consistent use of CPAP. ? ?Follow up in 31-90 days after starting on CPAP therapy with Dr. Craige Cotta or Katie Tazaria Dlugosz,NP. If symptoms do not improve or worsen, please contact office for sooner follow up or seek emergency care. ? ? ?Morbid obesity (HCC) ?BMI 46.3.  Discussed how this contributes to OSA.  Healthy weight management discussed today. ? ? ?I spent 32 minutes of dedicated to the care of this patient on the date of this encounter to include pre-visit review of records, face-to-face time with the patient discussing conditions above, post visit ordering of testing, clinical documentation with the electronic health record, making appropriate referrals as documented, and communicating necessary findings to members of the patients care team. ? ?Noemi Chapel, NP ?04/30/2021 ? ?Pt aware and  understands NP's role.  ? ?

## 2021-04-30 NOTE — Assessment & Plan Note (Addendum)
BMI 46.3.  Discussed how this contributes to OSA.  Healthy weight management discussed today. ?

## 2021-05-01 ENCOUNTER — Other Ambulatory Visit: Payer: Self-pay | Admitting: Family Medicine

## 2021-05-26 ENCOUNTER — Other Ambulatory Visit: Payer: Self-pay | Admitting: Registered Nurse

## 2021-05-27 ENCOUNTER — Telehealth: Payer: Self-pay | Admitting: Family Medicine

## 2021-05-27 ENCOUNTER — Other Ambulatory Visit: Payer: Self-pay

## 2021-05-27 DIAGNOSIS — F419 Anxiety disorder, unspecified: Secondary | ICD-10-CM

## 2021-05-27 MED ORDER — LISDEXAMFETAMINE DIMESYLATE 50 MG PO CAPS: 50.0000 mg | ORAL_CAPSULE | Freq: Every day | ORAL | 0 refills | Status: DC

## 2021-05-27 MED ORDER — BUPROPION HCL ER (XL) 150 MG PO TB24: 150.0000 mg | ORAL_TABLET | Freq: Every day | ORAL | 3 refills | Status: DC

## 2021-05-27 NOTE — Telephone Encounter (Signed)
Informed pt that his medicine was sent to mail order pharmacy as he desired .

## 2021-05-27 NOTE — Telephone Encounter (Signed)
Bupropion needs to go to the [facility] pt's wife states that they are telling her they didn't get it on 05/03/21

## 2021-05-27 NOTE — Telephone Encounter (Signed)
Patient is requesting a refill of the following medications: Requested Prescriptions   Pending Prescriptions Disp Refills   lisdexamfetamine (VYVANSE) 50 MG capsule 30 capsule 0    Sig: Take 1 capsule (50 mg total) by mouth daily.    Date of patient request: 05/26/2021 Last office visit: 04/30/2021 Date of last refill: 04/19/2021 Last refill amount: 30 capsules Follow up time period per chart: n/a

## 2021-06-24 ENCOUNTER — Other Ambulatory Visit: Payer: Self-pay | Admitting: Family Medicine

## 2021-06-24 MED ORDER — LISDEXAMFETAMINE DIMESYLATE 50 MG PO CAPS: 50.0000 mg | ORAL_CAPSULE | Freq: Every day | ORAL | 0 refills | Status: DC

## 2021-06-24 NOTE — Telephone Encounter (Signed)
Patient is requesting a refill of the following medications: Requested Prescriptions   Pending Prescriptions Disp Refills   lisdexamfetamine (VYVANSE) 50 MG capsule 30 capsule 0    Sig: Take 1 capsule (50 mg total) by mouth daily.    Date of patient request: 06/24/2021 Last office visit: 12/10/2020 Date of last refill: 05/27/2021 Last refill amount: 30 capsule Follow up time period per chart:

## 2021-07-26 ENCOUNTER — Other Ambulatory Visit: Payer: Self-pay | Admitting: Family Medicine

## 2021-07-26 MED ORDER — LISDEXAMFETAMINE DIMESYLATE 50 MG PO CAPS: 50.0000 mg | ORAL_CAPSULE | Freq: Every day | ORAL | 0 refills | Status: DC

## 2021-08-15 ENCOUNTER — Encounter: Payer: Self-pay | Admitting: Nurse Practitioner

## 2021-08-15 ENCOUNTER — Ambulatory Visit: Payer: 59 | Admitting: Nurse Practitioner

## 2021-08-15 DIAGNOSIS — G4733 Obstructive sleep apnea (adult) (pediatric): Secondary | ICD-10-CM | POA: Diagnosis not present

## 2021-08-15 NOTE — Patient Instructions (Signed)
Continue to use CPAP every night, minimum of 4-6 hours a night.  Change equipment every 30 days or as directed by DME. Wash your tubing with warm soap and water daily, hang to dry. Wash humidifier portion weekly.  Be aware of reduced alertness and do not drive or operate heavy machinery if experiencing this or drowsiness.  Exercise encouraged, as tolerated. Avoid or decrease alcohol consumption and medications that make you more sleepy, if possible. Notify if persistent daytime sleepiness occurs even with consistent use of CPAP.  Follow up in 3 months with Dr. Craige Cotta or Philis Nettle. If symptoms do not improve or worsen, please contact office for sooner follow up or seek emergency care.

## 2021-08-15 NOTE — Progress Notes (Signed)
Reviewed and agree with assessment/plan.   Coralyn Helling, MD [facility] Pulmonary/Critical Care 08/15/2021, 10:48 AM Pager:  [phone]

## 2021-08-15 NOTE — Progress Notes (Signed)
@Patient  ID: , male    DOB: September 15, 1990, 31 y.o.   MRN: [id number]  Chief Complaint  Patient presents with   Follow-up    Referring provider: [id number], MD  HPI: 31 year old male, never smoker followed for OSA. He is a patient of Dr. 34 and last seen in office 04/30/2021 by [facility] NP. Past medical history significant for obesity, ADHD, anxiety, persistent depressive disorder, allergic rhinitis.  TEST/EVENTS:  04/09/2021 HST: AHI 13.2, SpO2 low 74%; average 94%  02/26/2021: OV with Dr. 02/28/2021 for sleep consult. Reports snoring more and funny breathing at night per his wife. Recently found out from dentist he has bruxism. Reports getting around 8 hours of sleep a night; doesn't usually wake aside from occasionally needing to use the restroom. Wakes feeling terrible. Uses energy drinks to stay awake during the day. HST ordered for further eval.   04/30/2021: OV with Aaryav Hopfensperger NP for follow-up after home sleep study.  Home sleep study showed mild obstructive sleep apnea with AHI 13.2.  He continues to report snoring at night and daytime fatigue symptoms.  Wakes in the morning feeling like he did not rest well at all.  They do have a 50-month-old at home; reports that she has began to give them 6-hour stretches which helps some.  He denies morning headache or drowsy driving.  No history of sleepwalking, sleep talking or nightmares.  Denies sleep loose [place], sleep paralysis or cataplexy as well.  Drinks energy drinks throughout the day to help him stay awake. Started on CPAP auto 5-15 cmH2O therapy for mild to moderate OSA.   08/15/2021: Today - follow up Patient presents today for follow up after being initiated on CPAP therapy for mild to moderate OSA. He has felt better with CPAP therapy and feels like his sleep is more restorative. He has a 62 month old at home, which occasionally will sleep in their bed, so this has resulted in him not wearing it every night or sometimes taking  it off halfway through. He feels like the machine will make her stir more. They are currently trying to transition her to her crib but they also have their son who is in 7th grade and is getting ready to start school so they don't want to disrupt his sleep either. He is going to try to be more consistent with use. He does sleep on his side when he doesn't wear his CPAP. He denies morning headaches or drowsy driving. He does feel less fatigued overall. His wife doesn't notice him snoring when he uses the CPAP. Still drinks energy drinks throughout the day.   07/12/2021-08/10/2021:  CPAP auto 5-15 cmH2O 26/30 days; 53% >4 hr; av usage 4 hr 27 min Pressure median 8, 95th 10.7 Leaks median 6.8, 95th 20.3  AHI 0.2  No Known Allergies  Immunization History  Administered Date(s) Administered   Influenza,inj,Quad PF,6+ Mos 11/05/2020   PFIZER(Purple Top)SARS-COV-2 Vaccination 03/21/1999, 04/11/2019, 10/29/2019   Tdap 10/07/2010, 11/05/2020    Past Medical History:  Diagnosis Date   ADHD    Asthma    Bruxism    Depression with anxiety    Fatty liver    Hemorrhoid 2017   Hypertension    Pilonidal cyst     Tobacco History: Social History   Tobacco Use  Smoking Status Never  Smokeless Tobacco Never   Counseling given: Not Answered   Outpatient Medications Prior to Visit  Medication Sig Dispense Refill   albuterol (VENTOLIN  HFA) 108 (90 Base) MCG/ACT inhaler INHALE TWO PUFFS BY MOUTH EVERY 6 HOURS AS NEEDED FOR  WHEEZING  OR  SHORTNESS  OF  BREATH 8 g 1   buPROPion (WELLBUTRIN XL) 150 MG 24 hr tablet Take 1 tablet (150 mg total) by mouth daily. 90 tablet 3   levocetirizine (XYZAL) 5 MG tablet Take 5 mg by mouth every evening.     lisdexamfetamine (VYVANSE) 50 MG capsule Take 1 capsule (50 mg total) by mouth daily. 30 capsule 0   pantoprazole (PROTONIX) 40 MG tablet TAKE 1 TABLET BY MOUTH  DAILY 90 tablet 3   No facility-administered medications prior to visit.     Review of  Systems:   Constitutional: No weight loss or gain, night sweats, fevers, chills. +fatigue (improved) HEENT: No headaches, difficulty swallowing, tooth/dental problems, or sore throat. No sneezing, itching, ear ache, nasal congestion, or post nasal drip. +snoring (resolves with CPAP use) CV:  No chest pain, orthopnea, PND, swelling in lower extremities, anasarca, dizziness, palpitations, syncope Resp: No shortness of breath with exertion or at rest. No excess mucus or change in color of mucus. No productive or non-productive. No hemoptysis. No wheezing.  No chest wall deformity Skin: No rash, lesions, ulcerations Neuro: No dizziness or lightheadedness.  Psych: No depression or anxiety. Mood stable.     Physical Exam:  BP 126/86 (BP Location: Right Arm, Cuff Size: Large)   Pulse 94   Ht 6' (1.829 m)   Wt (!) 342 lb (155.1 kg)   SpO2 98%   BMI 46.38 kg/m   GEN: Pleasant, interactive, well-appearing; morbidly obese; in no acute distress. HEENT:  Normocephalic and atraumatic. PERRLA. Sclera white. Nasal turbinates pink, moist and patent bilaterally. No rhinorrhea present. Oropharynx pink and moist, without exudate or edema. No lesions, ulcerations, or postnasal drip.  NECK:  Supple w/ fair ROM. No JVD present. Normal carotid impulses w/o bruits. Thyroid symmetrical with no goiter or nodules palpated. No lymphadenopathy.   CV: RRR, no m/r/g, no peripheral edema. Pulses intact, +2 bilaterally. No cyanosis, pallor or clubbing. PULMONARY:  Unlabored, regular breathing. Clear bilaterally A&P w/o wheezes/rales/rhonchi. No accessory muscle use. No dullness to percussion. GI: BS present and normoactive. Soft, non-tender to palpation.  MSK: No erythema, warmth or tenderness. Cap refil <2 sec all extrem. No deformities or joint swelling noted.  Neuro: A/Ox3. No focal deficits noted.   Skin: Warm, no lesions or rashe Psych: Normal affect and behavior. Judgement and thought content appropriate.      Lab Results:  CBC    Component Value Date/Time   WBC 10.1 11/04/2019 1319   RBC 6.17 (H) 11/04/2019 1319   HGB 16.7 11/04/2019 1319   HCT 50.2 11/04/2019 1319   PLT 352.0 11/04/2019 1319   MCV 81.4 11/04/2019 1319   MCH 28.1 04/08/2017 1611   MCHC 33.3 11/04/2019 1319   RDW 13.8 11/04/2019 1319   LYMPHSABS 3.1 11/04/2019 1319   MONOABS 0.8 11/04/2019 1319   EOSABS 0.2 11/04/2019 1319   BASOSABS 0.0 11/04/2019 1319    BMET    Component Value Date/Time   NA 137 11/04/2019 1319   K 4.2 11/04/2019 1319   CL 100 11/04/2019 1319   CO2 27 11/04/2019 1319   GLUCOSE 70 11/04/2019 1319   BUN 15 11/04/2019 1319   CREATININE 1.02 11/04/2019 1319   CREATININE 0.92 04/08/2017 1611   CALCIUM 10.0 11/04/2019 1319   GFRNONAA >60 07/11/2014 1425   GFRAA >60 07/11/2014 1425    BNP  No results found for: "BNP"   Imaging:  No results found.        No data to display          No results found for: "NITRICOXIDE"      Assessment & Plan:   Mild obstructive sleep apnea Mild to moderate OSA with AHI 13.2. Recently started on CPAP therapy around 2 months ago. He has been wearing it almost every night but only meeting 4 hour minimum around 50% of the nights. He does receive good benefit from use and would like to continue using the machine and working on increasing his usage. Discussed that if he continues to have trouble coordinating usage and caring for their infant, we could look at switching to an oral appliance. Plans to reassess in 3 months. Cautioned to be aware of drowsy driving. Safe sleep practices reviewed regarding cosleeping.   Patient Instructions  Continue to use CPAP every night, minimum of 4-6 hours a night.  Change equipment every 30 days or as directed by DME. Wash your tubing with warm soap and water daily, hang to dry. Wash humidifier portion weekly.  Be aware of reduced alertness and do not drive or operate heavy machinery if experiencing this or  drowsiness.  Exercise encouraged, as tolerated. Avoid or decrease alcohol consumption and medications that make you more sleepy, if possible. Notify if persistent daytime sleepiness occurs even with consistent use of CPAP.  Follow up in 3 months with Dr. Craige Cotta or Philis Nettle. If symptoms do not improve or worsen, please contact office for sooner follow up or seek emergency care.      I spent 28 minutes of dedicated to the care of this patient on the date of this encounter to include pre-visit review of records, face-to-face time with the patient discussing conditions above, post visit ordering of testing, clinical documentation with the electronic health record, making appropriate referrals as documented, and communicating necessary findings to members of the patients care team.  Noemi Chapel, NP 08/15/2021  Pt aware and understands NP's role.

## 2021-08-15 NOTE — Assessment & Plan Note (Signed)
Mild to moderate OSA with AHI 13.2. Recently started on CPAP therapy around 2 months ago. He has been wearing it almost every night but only meeting 4 hour minimum around 50% of the nights. He does receive good benefit from use and would like to continue using the machine and working on increasing his usage. Discussed that if he continues to have trouble coordinating usage and caring for their infant, we could look at switching to an oral appliance. Plans to reassess in 3 months. Cautioned to be aware of drowsy driving. Safe sleep practices reviewed regarding cosleeping.   Patient Instructions  Continue to use CPAP every night, minimum of 4-6 hours a night.  Change equipment every 30 days or as directed by DME. Wash your tubing with warm soap and water daily, hang to dry. Wash humidifier portion weekly.  Be aware of reduced alertness and do not drive or operate heavy machinery if experiencing this or drowsiness.  Exercise encouraged, as tolerated. Avoid or decrease alcohol consumption and medications that make you more sleepy, if possible. Notify if persistent daytime sleepiness occurs even with consistent use of CPAP.  Follow up in 3 months with Dr. Craige Cotta or Philis Nettle. If symptoms do not improve or worsen, please contact office for sooner follow up or seek emergency care.

## 2021-08-26 ENCOUNTER — Other Ambulatory Visit: Payer: Self-pay | Admitting: Family Medicine

## 2021-08-26 NOTE — Telephone Encounter (Signed)
Patient is requesting a refill of the following medications: Requested Prescriptions   Pending Prescriptions Disp Refills   lisdexamfetamine (VYVANSE) 50 MG capsule 30 capsule 0    Sig: Take 1 capsule (50 mg total) by mouth daily.    Date of patient request: 08/26/2021 Last office visit: 12/10/2020 Date of last refill: 07/26/2021 Last refill amount: 30 capsules  Follow up time period per chart: n/a

## 2021-08-27 MED ORDER — LISDEXAMFETAMINE DIMESYLATE 50 MG PO CAPS: 50.0000 mg | ORAL_CAPSULE | Freq: Every day | ORAL | 0 refills | Status: DC

## 2021-09-30 ENCOUNTER — Other Ambulatory Visit: Payer: Self-pay | Admitting: Family Medicine

## 2021-09-30 NOTE — Telephone Encounter (Signed)
Patient is requesting a refill of the following medications: Requested Prescriptions   Pending Prescriptions Disp Refills   lisdexamfetamine (VYVANSE) 50 MG capsule 30 capsule 0    Sig: Take 1 capsule (50 mg total) by mouth daily.    Date of patient request: 09/30/21 Last office visit: 12/10/20 Date of last refill: 08/27/21 Last refill amount: 30

## 2021-10-01 MED ORDER — LISDEXAMFETAMINE DIMESYLATE 50 MG PO CAPS: 50.0000 mg | ORAL_CAPSULE | Freq: Every day | ORAL | 0 refills | Status: DC

## 2021-10-02 ENCOUNTER — Telehealth: Payer: Self-pay | Admitting: Family Medicine

## 2021-10-02 MED ORDER — LISDEXAMFETAMINE DIMESYLATE 50 MG PO CAPS: 50.0000 mg | ORAL_CAPSULE | Freq: Every day | ORAL | 0 refills | Status: DC

## 2021-10-02 NOTE — Telephone Encounter (Signed)
I resent the prescription with a note to pharmacy to dispense brand name only so that coupon card could be used

## 2021-10-02 NOTE — Addendum Note (Signed)
Addended by: Midge Minium on: 10/02/2021 09:45 AM   Modules accepted: Orders

## 2021-10-02 NOTE — Telephone Encounter (Signed)
Caller name: Nuh Lipton   On DPR? :yes/no: Yes  Call back number:[phone]  Provider they see:  Birdie Riddle   Reason for call: Wife states that pharmacy gave her generic of Vyvanse 50mg . Wife states that she has a coupon for the brand name. Wife want to know if Dr.Tabori can switch it back to the brand name. Pt states that it is cheaper that way. Please sent to [id number] - [place], [place].

## 2021-10-02 NOTE — Telephone Encounter (Signed)
Called wife Camie Patience informing her brand name has been sent in as requested

## 2021-11-01 ENCOUNTER — Other Ambulatory Visit: Payer: Self-pay

## 2021-11-01 ENCOUNTER — Other Ambulatory Visit: Payer: Self-pay | Admitting: Family Medicine

## 2021-11-01 MED ORDER — LEVOCETIRIZINE DIHYDROCHLORIDE 5 MG PO TABS: 5.0000 mg | ORAL_TABLET | Freq: Every evening | ORAL | 1 refills | Status: DC

## 2021-11-01 MED ORDER — LISDEXAMFETAMINE DIMESYLATE 50 MG PO CAPS: 50.0000 mg | ORAL_CAPSULE | Freq: Every day | ORAL | 0 refills | Status: DC

## 2021-11-01 NOTE — Telephone Encounter (Signed)
Vyvanse 50 mg LOV: 12/10/20 Last Refill:10/02/21 Upcoming appt: 11/07/21

## 2021-11-07 ENCOUNTER — Encounter: Payer: 59 | Admitting: Family Medicine

## 2021-12-05 ENCOUNTER — Other Ambulatory Visit: Payer: Self-pay | Admitting: Family Medicine

## 2021-12-06 MED ORDER — LISDEXAMFETAMINE DIMESYLATE 50 MG PO CAPS: 50.0000 mg | ORAL_CAPSULE | Freq: Every day | ORAL | 0 refills | Status: DC

## 2021-12-06 NOTE — Telephone Encounter (Signed)
Patient is requesting a refill of the following medications: Requested Prescriptions   Pending Prescriptions Disp Refills   lisdexamfetamine (VYVANSE) 50 MG capsule 30 capsule 0    Sig: Take 1 capsule (50 mg total) by mouth daily. BRAND NAME ONLY so pt can use coupon card    Date of patient request: 12/06/21 Last office visit: 12/10/20 Date of last refill: 11/01/21 Last refill amount: 15 Follow up time period per chart: 01/13/22

## 2022-01-05 ENCOUNTER — Other Ambulatory Visit: Payer: Self-pay | Admitting: Family Medicine

## 2022-01-07 ENCOUNTER — Telehealth: Payer: Self-pay | Admitting: Family Medicine

## 2022-01-07 MED ORDER — LISDEXAMFETAMINE DIMESYLATE 50 MG PO CAPS: 50.0000 mg | ORAL_CAPSULE | Freq: Every day | ORAL | 0 refills | Status: DC

## 2022-01-07 NOTE — Telephone Encounter (Signed)
Informed pt that his Rx has been sent to pharmacy  

## 2022-01-07 NOTE — Telephone Encounter (Signed)
Encourage patient to contact the pharmacy for refills or they can request refills through [facility]  (Please schedule appointment if patient has not been seen in over a year)  Last ov was 12/10/20  WHAT [place] THIS SENT TO: [id number] - [place], [place] [address]   MEDICATION NAME & DOSE: lisdexamfetamine 50 mg   NOTES/COMMENTS FROM PATIENT: Patient has a up coming physical with Dr.Tabori on 01/13/22      Front office please notify patient: It takes 48-72 hours to process rx refill requests Ask patient to call pharmacy to ensure rx is ready before heading there.

## 2022-01-07 NOTE — Telephone Encounter (Signed)
Lisdexamfetamine 50 mg LOV: 12/10/20 Last Refill:12/06/21 Upcoming appt: 01/13/22

## 2022-01-07 NOTE — Telephone Encounter (Signed)
Prescription sent to pharmacy as requested.

## 2022-01-13 ENCOUNTER — Encounter: Payer: Self-pay | Admitting: Family Medicine

## 2022-01-13 ENCOUNTER — Ambulatory Visit (INDEPENDENT_AMBULATORY_CARE_PROVIDER_SITE_OTHER): Payer: 59 | Admitting: Family Medicine

## 2022-01-13 VITALS — BP 122/86 | HR 89 | Temp 98.8°F | Resp 17 | Ht 72.0 in | Wt 351.0 lb

## 2022-01-13 DIAGNOSIS — F419 Anxiety disorder, unspecified: Secondary | ICD-10-CM

## 2022-01-13 DIAGNOSIS — Z0000 Encounter for general adult medical examination without abnormal findings: Secondary | ICD-10-CM

## 2022-01-13 DIAGNOSIS — Z23 Encounter for immunization: Secondary | ICD-10-CM

## 2022-01-13 LAB — CBC WITH DIFFERENTIAL/PLATELET
Basophils Absolute: 0 10*3/uL (ref 0.0–0.1)
Basophils Relative: 0.2 % (ref 0.0–3.0)
Eosinophils Absolute: 0.1 10*3/uL (ref 0.0–0.7)
Eosinophils Relative: 1 % (ref 0.0–5.0)
HCT: 49.7 % (ref 39.0–52.0)
Hemoglobin: 16.5 g/dL (ref 13.0–17.0)
Lymphocytes Relative: 31 % (ref 12.0–46.0)
Lymphs Abs: 3.3 10*3/uL (ref 0.7–4.0)
MCHC: 33.2 g/dL (ref 30.0–36.0)
MCV: 81.6 fl (ref 78.0–100.0)
Monocytes Absolute: 0.9 10*3/uL (ref 0.1–1.0)
Monocytes Relative: 8.9 % (ref 3.0–12.0)
Neutro Abs: 6.3 10*3/uL (ref 1.4–7.7)
Neutrophils Relative %: 58.9 % (ref 43.0–77.0)
Platelets: 383 10*3/uL (ref 150.0–400.0)
RBC: 6.09 Mil/uL — ABNORMAL HIGH (ref 4.22–5.81)
RDW: 14.9 % (ref 11.5–15.5)
WBC: 10.7 10*3/uL — ABNORMAL HIGH (ref 4.0–10.5)

## 2022-01-13 LAB — LIPID PANEL
Cholesterol: 196 mg/dL (ref 0–200)
HDL: 41.7 mg/dL (ref >39.00)
LDL Cholesterol: 129 mg/dL — ABNORMAL HIGH (ref 0–99)
NonHDL: 154.49
Total CHOL/HDL Ratio: 5
Triglycerides: 128 mg/dL (ref 0.0–149.0)
VLDL: 25.6 mg/dL (ref 0.0–40.0)

## 2022-01-13 LAB — BASIC METABOLIC PANEL
BUN: 12 mg/dL (ref 6–23)
CO2: 29 mEq/L (ref 19–32)
Calcium: 9.9 mg/dL (ref 8.4–10.5)
Chloride: 102 mEq/L (ref 96–112)
Creatinine, Ser: 1.04 mg/dL (ref 0.40–1.50)
GFR: 94.76 mL/min (ref >60.00)
Glucose, Bld: 79 mg/dL (ref 70–99)
Potassium: 4.4 mEq/L (ref 3.5–5.1)
Sodium: 137 mEq/L (ref 135–145)

## 2022-01-13 LAB — HEPATIC FUNCTION PANEL
ALT: 75 U/L — ABNORMAL HIGH (ref 0–53)
AST: 39 U/L — ABNORMAL HIGH (ref 0–37)
Albumin: 4.9 g/dL (ref 3.5–5.2)
Alkaline Phosphatase: 73 U/L (ref 39–117)
Bilirubin, Direct: 0.1 mg/dL (ref 0.0–0.3)
Total Bilirubin: 0.5 mg/dL (ref 0.2–1.2)
Total Protein: 8.3 g/dL (ref 6.0–8.3)

## 2022-01-13 LAB — TSH: TSH: 1.03 u[IU]/mL (ref 0.35–5.50)

## 2022-01-13 LAB — VITAMIN D 25 HYDROXY (VIT D DEFICIENCY, FRACTURES): VITD: 46.17 ng/mL (ref 30.00–100.00)

## 2022-01-13 LAB — HEMOGLOBIN A1C: Hgb A1c MFr Bld: 6.6 % — ABNORMAL HIGH (ref 4.6–6.5)

## 2022-01-13 MED ORDER — BUPROPION HCL ER (XL) 150 MG PO TB24: 150.0000 mg | ORAL_TABLET | Freq: Every day | ORAL | 3 refills | Status: DC

## 2022-01-13 MED ORDER — LISDEXAMFETAMINE DIMESYLATE 50 MG PO CAPS: 50.0000 mg | ORAL_CAPSULE | Freq: Every day | ORAL | 0 refills | Status: DC

## 2022-01-13 NOTE — Patient Instructions (Signed)
Follow up in 6 months to recheck weight loss progress and ADHD We'll notify you of your lab results and make any changes if needed Continue to work on healthy diet and regular exercise- you can do it! Call with any questions or concerns Stay Safe!  Stay Healthy! Happy New Year!!!

## 2022-01-13 NOTE — Progress Notes (Signed)
   Subjective:    Patient ID: Raymond Santana, male    DOB: 1990/04/25, 32 y.o.   MRN: [id number]  HPI CPE- UTD on Tdap.  Flu shot today.  Patient Care Team    Relationship Specialty Notifications Start End  Midge Minium, MD PCP - General Family Medicine  02/18/13      Health Maintenance  Topic Date Due   INFLUENZA VACCINE  08/06/2021   DTaP/Tdap/Td (3 - Td or Tdap) 11/06/2030   HPV VACCINES  Aged Out   COVID-19 Vaccine  Discontinued   Hepatitis C Screening  Discontinued   HIV Screening  Discontinued     Review of Systems Patient reports no vision/hearing changes, anorexia, fever ,adenopathy, persistant/recurrent hoarseness, swallowing issues, chest pain, palpitations, edema, persistant/recurrent cough, hemoptysis, dyspnea (rest,exertional, paroxysmal nocturnal), gastrointestinal  bleeding (melena, rectal bleeding), abdominal pain, excessive heart burn, GU symptoms (dysuria, hematuria, voiding/incontinence issues) syncope, focal weakness, memory loss, skin/hair/nail changes, depression, anxiety, abnormal bruising/bleeding, musculoskeletal symptoms/signs.   + 10 lb weight gain + R arm numbness- due for ulnar nerve transposition    Objective:   Physical Exam General Appearance:    Alert, cooperative, no distress, appears stated age, obese  Head:    Normocephalic, without obvious abnormality, atraumatic  Eyes:    PERRL, conjunctiva/corneas clear, EOM's intact both eyes       Ears:    Normal TM's and external ear canals, both ears  Nose:   Nares normal, septum midline, mucosa normal, no drainage   or sinus tenderness  Throat:   Lips, mucosa, and tongue normal; teeth and gums normal  Neck:   Supple, symmetrical, trachea midline, no adenopathy;       thyroid:  No enlargement/tenderness/nodules  Back:     Symmetric, no curvature, ROM normal, no CVA tenderness  Lungs:     Clear to auscultation bilaterally, respirations unlabored  Chest wall:    No tenderness or deformity   Heart:    Regular rate and rhythm, S1 and S2 normal, no murmur, rub   or gallop  Abdomen:     Soft, non-tender, bowel sounds active all four quadrants,    no masses, no organomegaly  Genitalia:    deferred  Rectal:    Extremities:   Extremities normal, atraumatic, no cyanosis or edema  Pulses:   2+ and symmetric all extremities  Skin:   Skin color, texture, turgor normal, no rashes or lesions  Lymph nodes:   Cervical, supraclavicular, and axillary nodes normal  Neurologic:   CNII-XII intact. Normal strength, sensation and reflexes      throughout          Assessment & Plan:

## 2022-01-13 NOTE — Assessment & Plan Note (Signed)
Deteriorated.  Pt has gained 10 lbs.  He is aware that he has gained weight and has started a diet and plans to start exercising.  Check labs to risk stratify.  Will follow.

## 2022-01-13 NOTE — Assessment & Plan Note (Signed)
Pt's PE WNL w/ exception of BMI.  UTD on Tdap.  Flu given today.  Check labs.  Anticipatory guidance provided.

## 2022-01-14 ENCOUNTER — Other Ambulatory Visit: Payer: Self-pay

## 2022-01-14 ENCOUNTER — Telehealth: Payer: Self-pay

## 2022-01-14 DIAGNOSIS — E119 Type 2 diabetes mellitus without complications: Secondary | ICD-10-CM

## 2022-01-14 DIAGNOSIS — R7989 Other specified abnormal findings of blood chemistry: Secondary | ICD-10-CM

## 2022-01-14 MED ORDER — SIMVASTATIN 20 MG PO TABS: 20.0000 mg | ORAL_TABLET | Freq: Every day | ORAL | 3 refills | Status: DC

## 2022-01-14 NOTE — Telephone Encounter (Signed)
Informed pt of lab results . He is scheduled for a lab only visit to repeat liver function and lab order is in . Simvastatin 20 mg has been to pharmacy as well

## 2022-01-14 NOTE — Telephone Encounter (Signed)
-----   Message from Midge Minium, MD sent at 01/14/2022  8:15 AM EST ----- Liver enzymes are again mildly elevated but this is likely due to weight gain.  We will repeat these at a lab only visit in 4-6 weeks (dx elevated LFTS)  Your A1C (indicator of diabetes) has jumped from 6 --> 6.6% which means you now have diabetes (anything higher than 6.5%)  Thankfully at this time, you don't need to start medication and this will improve w/ diet and exercise, but we will make sure we are following this closely.  You also want to make sure that you are having yearly eye exams and we will start you on a low dose cholesterol medication to prevent future complications (Simvastatin 20mg  nightly, #30, 3 refills)  Remainder of labs are stable and look good

## 2022-02-15 ENCOUNTER — Other Ambulatory Visit: Payer: Self-pay | Admitting: Family Medicine

## 2022-02-17 MED ORDER — LISDEXAMFETAMINE DIMESYLATE 50 MG PO CAPS: 50.0000 mg | ORAL_CAPSULE | Freq: Every day | ORAL | 0 refills | Status: DC

## 2022-02-17 NOTE — Telephone Encounter (Signed)
Pt aware rx has been sent in

## 2022-02-17 NOTE — Telephone Encounter (Signed)
Vyvanse 50 mg LOV: 01/13/22 Last Refill:01/13/22 Upcoming appt: 02/25/22

## 2022-02-25 ENCOUNTER — Other Ambulatory Visit (INDEPENDENT_AMBULATORY_CARE_PROVIDER_SITE_OTHER): Payer: 59

## 2022-02-25 DIAGNOSIS — R7989 Other specified abnormal findings of blood chemistry: Secondary | ICD-10-CM | POA: Diagnosis not present

## 2022-02-26 ENCOUNTER — Telehealth: Payer: Self-pay

## 2022-02-26 LAB — HEPATIC FUNCTION PANEL
ALT: 61 U/L — ABNORMAL HIGH (ref 0–53)
AST: 35 U/L (ref 0–37)
Albumin: 5.1 g/dL (ref 3.5–5.2)
Alkaline Phosphatase: 77 U/L (ref 39–117)
Bilirubin, Direct: 0.1 mg/dL (ref 0.0–0.3)
Total Bilirubin: 0.6 mg/dL (ref 0.2–1.2)
Total Protein: 8.4 g/dL — ABNORMAL HIGH (ref 6.0–8.3)

## 2022-02-26 NOTE — Telephone Encounter (Signed)
-----   Message from Midge Minium, MD sent at 02/26/2022  4:05 PM EST ----- Liver enzymes are moving in the right direction.  This is good news!

## 2022-02-26 NOTE — Telephone Encounter (Signed)
Left results on pt VM

## 2022-03-20 ENCOUNTER — Other Ambulatory Visit: Payer: Self-pay | Admitting: Family Medicine

## 2022-03-21 ENCOUNTER — Other Ambulatory Visit: Payer: Self-pay

## 2022-03-21 MED ORDER — LISDEXAMFETAMINE DIMESYLATE 50 MG PO CAPS: 50.0000 mg | ORAL_CAPSULE | Freq: Every day | ORAL | 0 refills | Status: DC

## 2022-03-24 ENCOUNTER — Other Ambulatory Visit: Payer: Self-pay

## 2022-03-24 MED ORDER — LISDEXAMFETAMINE DIMESYLATE 50 MG PO CAPS: 50.0000 mg | ORAL_CAPSULE | Freq: Every day | ORAL | 0 refills | Status: DC

## 2022-03-24 NOTE — Addendum Note (Signed)
Addended by: Midge Minium on: 03/24/2022 03:18 PM   Modules accepted: Orders

## 2022-04-10 ENCOUNTER — Other Ambulatory Visit: Payer: Self-pay | Admitting: Family Medicine

## 2022-04-10 DIAGNOSIS — F419 Anxiety disorder, unspecified: Secondary | ICD-10-CM

## 2022-04-23 ENCOUNTER — Other Ambulatory Visit: Payer: Self-pay | Admitting: Family Medicine

## 2022-04-23 MED ORDER — LISDEXAMFETAMINE DIMESYLATE 50 MG PO CAPS: 50.0000 mg | ORAL_CAPSULE | Freq: Every day | ORAL | 0 refills | Status: DC

## 2022-04-23 NOTE — Telephone Encounter (Signed)
Vyvanse 50 mg LOV: 01/13/22 Last Refill:03/24/22 Upcoming appt: none

## 2022-04-23 NOTE — Telephone Encounter (Signed)
Pt aware Rx has been sent in.  

## 2022-05-22 ENCOUNTER — Other Ambulatory Visit: Payer: Self-pay | Admitting: Family Medicine

## 2022-05-22 MED ORDER — LISDEXAMFETAMINE DIMESYLATE 50 MG PO CAPS: 50.0000 mg | ORAL_CAPSULE | Freq: Every day | ORAL | 0 refills | Status: DC

## 2022-05-22 NOTE — Telephone Encounter (Signed)
Pt aware that refill  has been sent in

## 2022-05-22 NOTE — Telephone Encounter (Signed)
Vyvanse 50 mg LOV: 01/13/22 Last Refill:04/23/22 Upcoming appt: none  Pt would like name brand

## 2022-06-09 ENCOUNTER — Other Ambulatory Visit: Payer: Self-pay | Admitting: Family Medicine

## 2022-06-12 ENCOUNTER — Other Ambulatory Visit: Payer: Self-pay

## 2022-06-12 DIAGNOSIS — J069 Acute upper respiratory infection, unspecified: Secondary | ICD-10-CM

## 2022-06-12 MED ORDER — LEVOCETIRIZINE DIHYDROCHLORIDE 5 MG PO TABS: 5.0000 mg | ORAL_TABLET | Freq: Every evening | ORAL | 1 refills | Status: DC

## 2022-06-24 ENCOUNTER — Encounter: Payer: Self-pay | Admitting: Family Medicine

## 2022-06-24 ENCOUNTER — Other Ambulatory Visit: Payer: Self-pay | Admitting: Family Medicine

## 2022-06-24 MED ORDER — LISDEXAMFETAMINE DIMESYLATE 50 MG PO CAPS: 50.0000 mg | ORAL_CAPSULE | Freq: Every day | ORAL | 0 refills | Status: DC

## 2022-06-24 NOTE — Telephone Encounter (Signed)
Left VM stating refill  

## 2022-06-24 NOTE — Telephone Encounter (Signed)
Vyvanse 50 mg LOV: 01/13/22 Last Refill:05/22/22 Upcoming appt: none

## 2022-06-26 ENCOUNTER — Ambulatory Visit: Payer: 59 | Admitting: Family Medicine

## 2022-06-30 ENCOUNTER — Ambulatory Visit: Payer: 59 | Admitting: Family Medicine

## 2022-06-30 ENCOUNTER — Encounter: Payer: Self-pay | Admitting: Family Medicine

## 2022-06-30 VITALS — BP 130/82 | HR 97 | Temp 97.8°F | Resp 18 | Ht 72.0 in | Wt 370.1 lb

## 2022-06-30 DIAGNOSIS — F419 Anxiety disorder, unspecified: Secondary | ICD-10-CM | POA: Diagnosis not present

## 2022-06-30 DIAGNOSIS — F32A Depression, unspecified: Secondary | ICD-10-CM | POA: Diagnosis not present

## 2022-06-30 MED ORDER — BUPROPION HCL ER (XL) 300 MG PO TB24: 300.0000 mg | ORAL_TABLET | Freq: Every day | ORAL | 1 refills | Status: DC

## 2022-06-30 NOTE — Progress Notes (Signed)
   Subjective:    Patient ID: Raymond Santana, male    DOB: 29-Oct-1990, 32 y.o.   MRN: [id number]  HPI Anxiety/Depression- pt reports he's been meaning to reach out but kept putting it off.  Noticed a change a 'few months ago'.  Pt finds himself slipping back into old moods/behaviors- having angry outbursts.  Pt has ongoing work stress and is having to care for daughter at home while working.  Pt has gained 20 lbs since last visit.   Review of Systems For ROS see HPI     Objective:   Physical Exam Vitals reviewed.  Constitutional:      General: He is not in acute distress.    Appearance: Normal appearance. He is obese. He is not ill-appearing.  HENT:     Head: Normocephalic and atraumatic.  Skin:    General: Skin is warm and dry.  Neurological:     General: No focal deficit present.     Mental Status: He is alert and oriented to person, place, and time.  Psychiatric:        Behavior: Behavior normal.        Thought Content: Thought content normal.     Comments: Flat affect           Assessment & Plan:

## 2022-06-30 NOTE — Patient Instructions (Signed)
Follow up in 3-4 weeks to recheck mood INCREASE the Wellbutrin to 300mg  daily- 2 of what you have at home and 1 of the new prescription Someone from [facility] should call you to schedule.  If you don't hear from them in the next week- let me know Call with any questions or concerns You can do this!  You deserve to feel better!!

## 2022-06-30 NOTE — Assessment & Plan Note (Signed)
Deteriorated.  Pt feels like things are backsliding to where he was before medication.  He is again irritable and angry.  Never sought counseling like previously discussed b/c he felt better on the medication.  Now admits he likely needs to talk through some things.  Will refer to psychology and in the mean time, will increase Wellbutrin to 300mg  daily and monitor closely.  Pt expressed understanding and is in agreement w/ plan.

## 2022-07-21 ENCOUNTER — Other Ambulatory Visit: Payer: Self-pay | Admitting: Family Medicine

## 2022-07-22 MED ORDER — LISDEXAMFETAMINE DIMESYLATE 50 MG PO CAPS: 50.0000 mg | ORAL_CAPSULE | Freq: Every day | ORAL | 0 refills | Status: DC

## 2022-07-22 NOTE — Telephone Encounter (Signed)
Patient is requesting a refill of the following medications: Requested Prescriptions   Pending Prescriptions Disp Refills   lisdexamfetamine (VYVANSE) 50 MG capsule 30 capsule 0    Sig: Take 1 capsule (50 mg total) by mouth daily. BRAND NAME ONLY so pt can use coupon card    Date of patient request: 07/22/22 Last office visit: 06/30/22 Date of last refill: 06/24/22 Last refill amount: 30 Follow up time period per chart: 3-4 weeks

## 2022-07-28 ENCOUNTER — Telehealth (INDEPENDENT_AMBULATORY_CARE_PROVIDER_SITE_OTHER): Payer: 59 | Admitting: Family Medicine

## 2022-07-28 ENCOUNTER — Encounter: Payer: Self-pay | Admitting: Family Medicine

## 2022-07-28 VITALS — Ht 72.0 in | Wt 350.0 lb

## 2022-07-28 DIAGNOSIS — F32A Depression, unspecified: Secondary | ICD-10-CM | POA: Diagnosis not present

## 2022-07-28 DIAGNOSIS — F419 Anxiety disorder, unspecified: Secondary | ICD-10-CM

## 2022-07-28 MED ORDER — FLUOXETINE HCL 20 MG PO CAPS: 20.0000 mg | ORAL_CAPSULE | Freq: Every day | ORAL | 3 refills | Status: DC

## 2022-07-28 NOTE — Progress Notes (Signed)
   Virtual Visit via Video   I connected with patient on 07/28/22 at 10:40 AM EDT by a video enabled telemedicine application and verified that I am speaking with the correct person using two identifiers.  Location patient: Home Location provider: [place], Office Persons participating in the virtual visit: Patient, Provider, CMA Sheryle Hail C)  I discussed the limitations of evaluation and management by telemedicine and the availability of in person appointments. The patient expressed understanding and agreed to proceed.  Subjective:   HPI:   Anxiety/Depression- at last visit we increased Wellbutrin to 300mg  daily.  Pt reports only small improvement.  Pt feels that currently both anxiety and depression are not well controlled.    ROS:   See pertinent positives and negatives per HPI.  Patient Active Problem List   Diagnosis Date Noted   Mild obstructive sleep apnea 04/30/2021   ADHD (attention deficit hyperactivity disorder), combined type 10/21/2018   Anxiety and depression 10/21/2018   Persistent depressive disorder 10/21/2018   Binge eating 10/21/2018   Morbid obesity (HCC) 04/08/2017   Bleeding hemorrhoid 08/09/2015   Physical exam 02/02/2015   Wheezing 09/15/2013   Lumbar back pain 02/18/2013    Social History   Tobacco Use   Smoking status: Never   Smokeless tobacco: Never  Substance Use Topics   Alcohol use: Yes    Comment: ocassionally    Current Outpatient Medications:    albuterol (VENTOLIN HFA) 108 (90 Base) MCG/ACT inhaler, INHALE TWO PUFFS BY MOUTH EVERY 6 HOURS AS NEEDED FOR  WHEEZING  OR  SHORTNESS  OF  BREATH, Disp: 8 g, Rfl: 1   buPROPion (WELLBUTRIN XL) 300 MG 24 hr tablet, Take 1 tablet (300 mg total) by mouth daily., Disp: 90 tablet, Rfl: 1   levocetirizine (XYZAL) 5 MG tablet, Take 1 tablet (5 mg total) by mouth every evening., Disp: 90 tablet, Rfl: 1   lisdexamfetamine (VYVANSE) 50 MG capsule, Take 1 capsule (50 mg total) by mouth daily.  BRAND NAME ONLY so pt can use coupon card, Disp: 30 capsule, Rfl: 0   pantoprazole (PROTONIX) 40 MG tablet, TAKE 1 TABLET BY MOUTH DAILY, Disp: 90 tablet, Rfl: 3   simvastatin (ZOCOR) 20 MG tablet, Take 1 tablet (20 mg total) by mouth at bedtime., Disp: 30 tablet, Rfl: 3  No Known Allergies  Objective:   Ht 6' (1.829 m)   Wt (!) 350 lb (158.8 kg)   BMI 47.47 kg/m  AAOx3, NAD NCAT, EOMI No obvious CN deficits Coloring WNL Pt is able to speak clearly, coherently without shortness of breath or increased work of breathing.  Thought process is linear.  Mood is appropriate.   Assessment and Plan:   Anxiety/Depression- minimal improvement w/ increased dose of Wellbutrin.  Feels that both anxiety and depression are uncontrolled at this time.  He is fearful that this will impact his work and personal relationships.  Will add Prozac 20mg  daily and monitor mood closely.  Pt expressed understanding and is in agreement w/ plan.    Neena Rhymes, MD 07/28/2022

## 2022-08-21 ENCOUNTER — Other Ambulatory Visit: Payer: Self-pay | Admitting: Family Medicine

## 2022-08-25 ENCOUNTER — Other Ambulatory Visit: Payer: Self-pay | Admitting: Family Medicine

## 2022-08-26 MED ORDER — LISDEXAMFETAMINE DIMESYLATE 50 MG PO CAPS: 50.0000 mg | ORAL_CAPSULE | Freq: Every day | ORAL | 0 refills | Status: DC

## 2022-08-26 NOTE — Telephone Encounter (Signed)
Vyvanse 50 mg  Requested Prescriptions   Pending Prescriptions Disp Refills   lisdexamfetamine (VYVANSE) 50 MG capsule 30 capsule 0    Sig: Take 1 capsule (50 mg total) by mouth daily. BRAND NAME ONLY so pt can use coupon card     Date of patient request: 08/26/22 Last office visit: 08/02/22 Date of last refill: 07/22/22 Last refill amount: 30 Follow up time period per chart: PRN

## 2022-09-25 ENCOUNTER — Other Ambulatory Visit: Payer: Self-pay | Admitting: Family Medicine

## 2022-09-25 MED ORDER — LISDEXAMFETAMINE DIMESYLATE 50 MG PO CAPS: 50.0000 mg | ORAL_CAPSULE | Freq: Every day | ORAL | 0 refills | Status: DC

## 2022-09-25 NOTE — Telephone Encounter (Signed)
Patient is requesting a refill of the following medications: Requested Prescriptions   Pending Prescriptions Disp Refills   lisdexamfetamine (VYVANSE) 50 MG capsule 30 capsule 0    Sig: Take 1 capsule (50 mg total) by mouth daily. BRAND NAME ONLY so pt can use coupon card    Date of patient request: 09/25/22 Last office visit: 06/30/22 Date of last refill: 08/26/22 Last refill amount: 30 Follow up time period per chart: 4 weeks

## 2022-10-01 ENCOUNTER — Telehealth: Payer: 59 | Admitting: Family Medicine

## 2022-10-22 ENCOUNTER — Other Ambulatory Visit: Payer: Self-pay | Admitting: Family Medicine

## 2022-10-22 NOTE — Telephone Encounter (Signed)
Last refill 08/21/2022 90 day with 2 refills

## 2022-10-28 ENCOUNTER — Other Ambulatory Visit: Payer: Self-pay | Admitting: Family Medicine

## 2022-10-28 NOTE — Telephone Encounter (Signed)
Last office visit 09/25/2022 Last refill 06/30/2022 30 day

## 2022-10-29 MED ORDER — LISDEXAMFETAMINE DIMESYLATE 50 MG PO CAPS: 50.0000 mg | ORAL_CAPSULE | Freq: Every day | ORAL | 0 refills | Status: DC

## 2022-11-11 ENCOUNTER — Encounter: Payer: Self-pay | Admitting: Family Medicine

## 2022-11-11 ENCOUNTER — Other Ambulatory Visit: Payer: Self-pay | Admitting: Family Medicine

## 2022-11-11 ENCOUNTER — Telehealth (INDEPENDENT_AMBULATORY_CARE_PROVIDER_SITE_OTHER): Payer: 59 | Admitting: Family Medicine

## 2022-11-11 DIAGNOSIS — F32A Depression, unspecified: Secondary | ICD-10-CM | POA: Diagnosis not present

## 2022-11-11 DIAGNOSIS — F419 Anxiety disorder, unspecified: Secondary | ICD-10-CM | POA: Diagnosis not present

## 2022-11-11 MED ORDER — FLUOXETINE HCL 40 MG PO CAPS: 40.0000 mg | ORAL_CAPSULE | Freq: Every day | ORAL | 1 refills | Status: DC

## 2022-11-11 NOTE — Progress Notes (Signed)
   Virtual Visit via Video   I connected with patient on 11/11/22 at 11:20 AM EST by a video enabled telemedicine application and verified that I am speaking with the correct person using two identifiers.  Location patient: Home Location provider: [place], Office Persons participating in the virtual visit: Patient, Provider, CMA Archie Patten H)  I discussed the limitations of evaluation and management by telemedicine and the availability of in person appointments. The patient expressed understanding and agreed to proceed.  Subjective:   HPI:   Anxiety/Depression- at last visit we added Fluoxetine 20mg  to his dose of Wellbutrin.  Feels that we're on the right track but is waking up with thoughts racing.  This is causing problems w/ his sleep.  No side effects from Fluoxetine.  Wife reports mood is better.  ROS:   See pertinent positives and negatives per HPI.  Patient Active Problem List   Diagnosis Date Noted   Mild obstructive sleep apnea 04/30/2021   ADHD (attention deficit hyperactivity disorder), combined type 10/21/2018   Anxiety and depression 10/21/2018   Persistent depressive disorder 10/21/2018   Binge eating 10/21/2018   Morbid obesity (HCC) 04/08/2017   Bleeding hemorrhoid 08/09/2015   Physical exam 02/02/2015   Wheezing 09/15/2013   Lumbar back pain 02/18/2013    Social History   Tobacco Use   Smoking status: Never   Smokeless tobacco: Never  Substance Use Topics   Alcohol use: Yes    Comment: ocassionally    Current Outpatient Medications:    albuterol (VENTOLIN HFA) 108 (90 Base) MCG/ACT inhaler, INHALE TWO PUFFS BY MOUTH EVERY 6 HOURS AS NEEDED FOR  WHEEZING  OR  SHORTNESS  OF  BREATH, Disp: 8 g, Rfl: 1   buPROPion (WELLBUTRIN XL) 300 MG 24 hr tablet, TAKE 1 TABLET BY MOUTH DAILY, Disp: 90 tablet, Rfl: 3   FLUoxetine (PROZAC) 20 MG capsule, TAKE 1 CAPSULE BY MOUTH EVERY DAY, Disp: 90 capsule, Rfl: 2   levocetirizine (XYZAL) 5 MG tablet, Take 1  tablet (5 mg total) by mouth every evening., Disp: 90 tablet, Rfl: 1   lisdexamfetamine (VYVANSE) 50 MG capsule, Take 1 capsule (50 mg total) by mouth daily. BRAND NAME ONLY so pt can use coupon card, Disp: 30 capsule, Rfl: 0   pantoprazole (PROTONIX) 40 MG tablet, TAKE 1 TABLET BY MOUTH DAILY, Disp: 90 tablet, Rfl: 3   simvastatin (ZOCOR) 20 MG tablet, Take 1 tablet (20 mg total) by mouth at bedtime., Disp: 30 tablet, Rfl: 3  No Known Allergies  Objective:   There were no vitals taken for this visit. AAOx3, NAD NCAT, EOMI No obvious CN deficits Coloring WNL Pt is able to speak clearly, coherently without shortness of breath or increased work of breathing.  Thought process is linear.  Mood is appropriate.   Assessment and Plan:   Anxiety/depression- improving.  Pt feels that things are heading in the right direction w/ the addition of Fluoxetine.  He is still having a hard time sleeping due to racing thoughts.  Since he is tolerating the Fluoxetine, will increase dose to 40mg  daily and monitor for improvement.  He is to continue the Wellbutrin and Vyvanse as well.  Pt expressed understanding and is in agreement w/ plan.    Neena Rhymes, MD 11/11/2022

## 2022-11-27 ENCOUNTER — Other Ambulatory Visit: Payer: Self-pay | Admitting: Family Medicine

## 2022-11-27 MED ORDER — LISDEXAMFETAMINE DIMESYLATE 50 MG PO CAPS: 50.0000 mg | ORAL_CAPSULE | Freq: Every day | ORAL | 0 refills | Status: DC

## 2022-11-27 NOTE — Telephone Encounter (Signed)
Requested Prescriptions   Pending Prescriptions Disp Refills   lisdexamfetamine (VYVANSE) 50 MG capsule 30 capsule 0    Sig: Take 1 capsule (50 mg total) by mouth daily. BRAND NAME ONLY so pt can use coupon card     Date of patient request: 11/27/22 11/11/2022 Future Visit date not found Date of last refill: 10/29/22 Last refill amount: 30

## 2022-12-29 ENCOUNTER — Other Ambulatory Visit: Payer: Self-pay | Admitting: Family Medicine

## 2022-12-29 MED ORDER — LISDEXAMFETAMINE DIMESYLATE 50 MG PO CAPS: 50.0000 mg | ORAL_CAPSULE | Freq: Every day | ORAL | 0 refills | Status: DC

## 2022-12-29 NOTE — Telephone Encounter (Signed)
Requested Prescriptions   Pending Prescriptions Disp Refills   lisdexamfetamine (VYVANSE) 50 MG capsule 30 capsule 0    Sig: Take 1 capsule (50 mg total) by mouth daily. BRAND NAME ONLY so pt can use coupon card     Date of patient request: 12/29/2022 Last office visit: 06/30/2022 Upcoming visit: Visit date not found Date of last refill: 11/27/2022 Last refill amount: 30

## 2023-01-29 ENCOUNTER — Other Ambulatory Visit: Payer: Self-pay | Admitting: Family Medicine

## 2023-02-02 MED ORDER — LISDEXAMFETAMINE DIMESYLATE 50 MG PO CAPS: 50.0000 mg | ORAL_CAPSULE | Freq: Every day | ORAL | 0 refills | Status: DC

## 2023-02-03 ENCOUNTER — Other Ambulatory Visit: Payer: Self-pay

## 2023-02-03 MED ORDER — LISDEXAMFETAMINE DIMESYLATE 50 MG PO CAPS: 50.0000 mg | ORAL_CAPSULE | Freq: Every day | ORAL | 0 refills | Status: DC

## 2023-02-03 NOTE — Telephone Encounter (Signed)
Copied from CRM [id number]. Topic: Clinical - Prescription Issue >> Feb 03, 2023  2:29 PM Alcus Dad H wrote: Reason for CRM: Refill prescription for lisdexamfetamine (VYVANSE) 50 MG capsule was sent on yesterday to [facility] and they advised the patient the medication is on backorder. Patient is requesting script to [facility] [address] [facility] [place]

## 2023-02-03 NOTE — Telephone Encounter (Signed)
Requested Prescriptions   Pending Prescriptions Disp Refills   lisdexamfetamine (VYVANSE) 50 MG capsule 30 capsule 0    Sig: Take 1 capsule (50 mg total) by mouth daily. BRAND NAME ONLY so pt can use coupon card     Date of patient request: 02/03/23 Last office visit: 11/11/2022 Upcoming visit: No appt found Date of last refill: 02/02/2023 Last refill amount: 30   [facility] where it was sent 02/02/2023 is on backorder but [facility] [address] [facility]

## 2023-02-25 ENCOUNTER — Encounter: Payer: 59 | Admitting: Family Medicine

## 2023-02-27 ENCOUNTER — Ambulatory Visit (INDEPENDENT_AMBULATORY_CARE_PROVIDER_SITE_OTHER): Payer: 59 | Admitting: Family Medicine

## 2023-02-27 ENCOUNTER — Encounter: Payer: Self-pay | Admitting: Family Medicine

## 2023-02-27 VITALS — BP 140/110 | HR 96 | Temp 97.8°F | Ht 71.25 in | Wt 348.8 lb

## 2023-02-27 DIAGNOSIS — Z0000 Encounter for general adult medical examination without abnormal findings: Secondary | ICD-10-CM | POA: Diagnosis not present

## 2023-02-27 DIAGNOSIS — Z1159 Encounter for screening for other viral diseases: Secondary | ICD-10-CM

## 2023-02-27 DIAGNOSIS — N529 Male erectile dysfunction, unspecified: Secondary | ICD-10-CM

## 2023-02-27 DIAGNOSIS — Z114 Encounter for screening for human immunodeficiency virus [HIV]: Secondary | ICD-10-CM

## 2023-02-27 LAB — LIPID PANEL
Cholesterol: 250 mg/dL — ABNORMAL HIGH (ref 0–200)
HDL: 45.7 mg/dL (ref >39.00)
LDL Cholesterol: 171 mg/dL — ABNORMAL HIGH (ref 0–99)
NonHDL: 204.04
Total CHOL/HDL Ratio: 5
Triglycerides: 163 mg/dL — ABNORMAL HIGH (ref 0.0–149.0)
VLDL: 32.6 mg/dL (ref 0.0–40.0)

## 2023-02-27 LAB — CBC WITH DIFFERENTIAL/PLATELET
Basophils Absolute: 0 10*3/uL (ref 0.0–0.1)
Basophils Relative: 0.2 % (ref 0.0–3.0)
Eosinophils Absolute: 0.1 10*3/uL (ref 0.0–0.7)
Eosinophils Relative: 1.1 % (ref 0.0–5.0)
HCT: 51.9 % (ref 39.0–52.0)
Hemoglobin: 17.2 g/dL — ABNORMAL HIGH (ref 13.0–17.0)
Lymphocytes Relative: 22.5 % (ref 12.0–46.0)
Lymphs Abs: 2.4 10*3/uL (ref 0.7–4.0)
MCHC: 33.1 g/dL (ref 30.0–36.0)
MCV: 83.5 fL (ref 78.0–100.0)
Monocytes Absolute: 0.8 10*3/uL (ref 0.1–1.0)
Monocytes Relative: 7.6 % (ref 3.0–12.0)
Neutro Abs: 7.2 10*3/uL (ref 1.4–7.7)
Neutrophils Relative %: 68.6 % (ref 43.0–77.0)
Platelets: 375 10*3/uL (ref 150.0–400.0)
RBC: 6.22 Mil/uL — ABNORMAL HIGH (ref 4.22–5.81)
RDW: 14.5 % (ref 11.5–15.5)
WBC: 10.5 10*3/uL (ref 4.0–10.5)

## 2023-02-27 LAB — BASIC METABOLIC PANEL
BUN: 14 mg/dL (ref 6–23)
CO2: 26 meq/L (ref 19–32)
Calcium: 9.6 mg/dL (ref 8.4–10.5)
Chloride: 101 meq/L (ref 96–112)
Creatinine, Ser: 1.14 mg/dL (ref 0.40–1.50)
GFR: 84.21 mL/min (ref >60.00)
Glucose, Bld: 95 mg/dL (ref 70–99)
Potassium: 4.3 meq/L (ref 3.5–5.1)
Sodium: 138 meq/L (ref 135–145)

## 2023-02-27 LAB — HEPATIC FUNCTION PANEL
ALT: 75 U/L — ABNORMAL HIGH (ref 0–53)
AST: 44 U/L — ABNORMAL HIGH (ref 0–37)
Albumin: 4.8 g/dL (ref 3.5–5.2)
Alkaline Phosphatase: 76 U/L (ref 39–117)
Bilirubin, Direct: 0.1 mg/dL (ref 0.0–0.3)
Total Bilirubin: 0.7 mg/dL (ref 0.2–1.2)
Total Protein: 8.4 g/dL — ABNORMAL HIGH (ref 6.0–8.3)

## 2023-02-27 LAB — VITAMIN D 25 HYDROXY (VIT D DEFICIENCY, FRACTURES): VITD: 33.86 ng/mL (ref 30.00–100.00)

## 2023-02-27 LAB — HEMOGLOBIN A1C: Hgb A1c MFr Bld: 6.4 % (ref 4.6–6.5)

## 2023-02-27 LAB — TSH: TSH: 1.61 u[IU]/mL (ref 0.35–5.50)

## 2023-02-27 MED ORDER — SILDENAFIL CITRATE 100 MG PO TABS: 50.0000 mg | ORAL_TABLET | Freq: Every day | ORAL | 11 refills | Status: DC | PRN

## 2023-02-27 NOTE — Assessment & Plan Note (Signed)
Ongoing issue for pt.  Encouraged low carb diet and regular exercise.  Check labs to risk stratify.  Will follow.

## 2023-02-27 NOTE — Assessment & Plan Note (Signed)
New.  Likely multifactorial.  Obesity, performance anxiety.  Reports Cialis was helpful from an online company but was very expensive.  Will switch to Sildenafil which is covered.  Pt expressed understanding and is in agreement w/ plan.

## 2023-02-27 NOTE — Progress Notes (Signed)
   Subjective:    Patient ID: Raymond Santana, male    DOB: April 21, 1990, 33 y.o.   MRN: [id number]  HPI CPE- UTD on flu, Tdap  Patient Care Team    Relationship Specialty Notifications Start End  Sheliah Hatch, MD PCP - General Family Medicine  02/18/13     Health Maintenance  Topic Date Due   HIV Screening  Never done   Hepatitis C Screening  Never done   COVID-19 Vaccine (4 - 2024-25 season) 09/07/2022   INFLUENZA VACCINE  04/07/2023 (Originally 08/07/2022)   DTaP/Tdap/Td (3 - Td or Tdap) 11/06/2030   HPV VACCINES  Aged Out      Review of Systems Patient reports no vision/hearing changes, anorexia, fever ,adenopathy, persistant/recurrent hoarseness, swallowing issues, chest pain, palpitations, edema, persistant/recurrent cough, hemoptysis, dyspnea (rest,exertional, paroxysmal nocturnal), gastrointestinal  bleeding (melena, rectal bleeding), abdominal pain, excessive heart burn, GU symptoms (dysuria, hematuria, voiding/incontinence issues) syncope, focal weakness, memory loss, numbness & tingling, skin/hair/nail changes, depression, anxiety, abnormal bruising/bleeding, musculoskeletal symptoms/signs.   ED- pt tried online pharmacy, was prescribed Tadalafil.  Pt reports Sildenafil is covered.    Objective:   Physical Exam General Appearance:    Alert, cooperative, no distress, appears stated age, obese  Head:    Normocephalic, without obvious abnormality, atraumatic  Eyes:    PERRL, conjunctiva/corneas clear, EOM's intact both eyes       Ears:    Normal TM's and external ear canals, both ears  Nose:   Nares normal, septum midline, mucosa normal, no drainage   or sinus tenderness  Throat:   Lips, mucosa, and tongue normal; teeth and gums normal  Neck:   Supple, symmetrical, trachea midline, no adenopathy;       thyroid:  No enlargement/tenderness/nodules  Back:     Symmetric, no curvature, ROM normal, no CVA tenderness  Lungs:     Clear to auscultation bilaterally,  respirations unlabored  Chest wall:    No tenderness or deformity  Heart:    Regular rate and rhythm, S1 and S2 normal, no murmur, rub   or gallop  Abdomen:     Soft, non-tender, bowel sounds active all four quadrants,    no masses, no organomegaly  Genitalia:    deferred  Rectal:    Extremities:   Extremities normal, atraumatic, no cyanosis or edema  Pulses:   2+ and symmetric all extremities  Skin:   Skin color, texture, turgor normal, no rashes or lesions  Lymph nodes:   Cervical, supraclavicular, and axillary nodes normal  Neurologic:   CNII-XII intact. Normal strength, sensation and reflexes      throughout          Assessment & Plan:

## 2023-02-27 NOTE — Patient Instructions (Signed)
Follow up in 6 months to recheck cholesterol We'll notify you of your lab results and make any changes if needed Make sure you are eater smaller amounts but more frequently throughout the day Drink LOTS of water Continue to use lotion on your feet Take the Sildenafil as needed Call with any questions or concerns Stay Safe!  Stay Healthy! Happy Birthday!!!

## 2023-02-27 NOTE — Assessment & Plan Note (Signed)
Pt's PE WNL w/ exception of obesity.  UTD on flu, Tdap.  Check labs.  Anticipatory guidance provided.

## 2023-02-28 LAB — HIV ANTIBODY (ROUTINE TESTING W REFLEX): HIV 1&2 Ab, 4th Generation: NONREACTIVE

## 2023-02-28 LAB — HEPATITIS C ANTIBODY: Hepatitis C Ab: NONREACTIVE

## 2023-03-02 ENCOUNTER — Encounter: Payer: Self-pay | Admitting: Family Medicine

## 2023-03-03 ENCOUNTER — Other Ambulatory Visit: Payer: Self-pay

## 2023-03-03 ENCOUNTER — Telehealth: Payer: Self-pay

## 2023-03-03 DIAGNOSIS — R748 Abnormal levels of other serum enzymes: Secondary | ICD-10-CM

## 2023-03-03 MED ORDER — ROSUVASTATIN CALCIUM 20 MG PO TABS: 20.0000 mg | ORAL_TABLET | Freq: Every day | ORAL | 1 refills | Status: DC

## 2023-03-03 NOTE — Telephone Encounter (Signed)
-----   Message from Neena Rhymes sent at 03/02/2023  7:54 AM EST ----- Total cholesterol and LDL (bad cholesterol) have both increased by about 50 points.  This will improve w/ healthy diet and regular exercise, but in the meantime, we need to start Crestor 20mg  nightly (#90, 1 refill)  Your liver enzymes are again elevated but overall stable.  We will need to check these at a lab only visit in 6 weeks (LFTs, dx elevated LFTs) to make sure they are stable and not climbing.  Your A1C is right on the cusp of diabetes.  This will improve w/ low carb/low sugar diet and regular exercise  Remainder of labs look good

## 2023-03-03 NOTE — Telephone Encounter (Signed)
 Left vm to call office about lab results

## 2023-03-05 ENCOUNTER — Other Ambulatory Visit: Payer: Self-pay | Admitting: Family Medicine

## 2023-03-05 NOTE — Telephone Encounter (Signed)
 Requested Prescriptions   Pending Prescriptions Disp Refills   lisdexamfetamine (VYVANSE) 50 MG capsule 30 capsule 0    Sig: Take 1 capsule (50 mg total) by mouth daily. BRAND NAME ONLY so pt can use coupon card     Date of patient request: 03/05/2023 Last office visit: 02/27/2023 Upcoming visit: 04/10/2023 Date of last refill: 01/31/2023  Last refill amount: 30 caps 0 refills

## 2023-03-05 NOTE — Telephone Encounter (Signed)
 Lab results have been discussed.   Verbalized understanding? Yes  Are there any questions? No     6 week lab recheck and 6 month follow up appointment scheduled.

## 2023-03-06 MED ORDER — LISDEXAMFETAMINE DIMESYLATE 50 MG PO CAPS: 50.0000 mg | ORAL_CAPSULE | Freq: Every day | ORAL | 0 refills | Status: DC

## 2023-03-24 ENCOUNTER — Other Ambulatory Visit: Payer: Self-pay | Admitting: Family Medicine

## 2023-03-24 DIAGNOSIS — J069 Acute upper respiratory infection, unspecified: Secondary | ICD-10-CM

## 2023-03-30 ENCOUNTER — Other Ambulatory Visit: Payer: Self-pay

## 2023-03-30 MED ORDER — FLUOXETINE HCL 40 MG PO CAPS: 40.0000 mg | ORAL_CAPSULE | Freq: Every day | ORAL | 1 refills | Status: DC

## 2023-04-07 ENCOUNTER — Other Ambulatory Visit: Payer: Self-pay | Admitting: Family Medicine

## 2023-04-08 ENCOUNTER — Other Ambulatory Visit: Payer: Self-pay | Admitting: Family Medicine

## 2023-04-08 DIAGNOSIS — J069 Acute upper respiratory infection, unspecified: Secondary | ICD-10-CM

## 2023-04-08 MED ORDER — LISDEXAMFETAMINE DIMESYLATE 50 MG PO CAPS: 50.0000 mg | ORAL_CAPSULE | Freq: Every day | ORAL | 0 refills | Status: DC

## 2023-04-08 NOTE — Telephone Encounter (Signed)
 Requested Prescriptions   Pending Prescriptions Disp Refills   lisdexamfetamine (VYVANSE) 50 MG capsule 30 capsule 0    Sig: Take 1 capsule (50 mg total) by mouth daily. BRAND NAME ONLY so pt can use coupon card     Date of patient request: 04/08/2023 Last office visit: 02/27/2023 Upcoming visit: 04/10/2023 Date of last refill: 03/06/2023 Last refill amount: 30

## 2023-04-10 ENCOUNTER — Ambulatory Visit: Payer: 59 | Admitting: Family Medicine

## 2023-04-10 ENCOUNTER — Encounter: Payer: Self-pay | Admitting: Family Medicine

## 2023-04-10 VITALS — BP 126/76 | HR 98 | Temp 98.0°F | Ht 71.25 in | Wt 354.4 lb

## 2023-04-10 DIAGNOSIS — F419 Anxiety disorder, unspecified: Secondary | ICD-10-CM

## 2023-04-10 DIAGNOSIS — E785 Hyperlipidemia, unspecified: Secondary | ICD-10-CM | POA: Diagnosis not present

## 2023-04-10 DIAGNOSIS — F32A Depression, unspecified: Secondary | ICD-10-CM | POA: Diagnosis not present

## 2023-04-10 NOTE — Patient Instructions (Signed)
 Follow up in 6 months to recheck cholesterol We'll notify you of your lab results and make any changes if needed Continue to work on healthy diet and regular exercise- you can do it! Ask your insurance about coverage for Zepbound or [facility] for weight loss Call with any questions or concerns [place]!!

## 2023-04-10 NOTE — Progress Notes (Signed)
   Subjective:    Patient ID: Raymond Santana, male    DOB: [birth year], 33 y.o.   MRN: [id number]  HPI Hyperlipidemia- chronic problem.  Started on Crestor 20mg  daily.  Denies CP, SOB, abd pain, N/V  Obesity- ongoing issue.  Pt has gained 5 lbs since last visit.  Pt reports some decreased energy.    Anxiety/Depression- ongoing issue.  Pt reports mood is doing well on Wellbutrin 300mg  and Fluoxetine 40mg  daily   Review of Systems For ROS see HPI     Objective:   Physical Exam Vitals reviewed.  Constitutional:      General: He is not in acute distress.    Appearance: He is well-developed. He is obese. He is not ill-appearing.  HENT:     Head: Normocephalic and atraumatic.  Eyes:     Extraocular Movements: Extraocular movements intact.     Conjunctiva/sclera: Conjunctivae normal.     Pupils: Pupils are equal, round, and reactive to light.  Neck:     Thyroid: No thyromegaly.  Cardiovascular:     Rate and Rhythm: Normal rate and regular rhythm.     Pulses: Normal pulses.     Heart sounds: Normal heart sounds. No murmur heard. Pulmonary:     Effort: Pulmonary effort is normal. No respiratory distress.     Breath sounds: Normal breath sounds.  Abdominal:     General: Bowel sounds are normal. There is no distension.     Palpations: Abdomen is soft.  Musculoskeletal:     Cervical back: Normal range of motion and neck supple.     Right lower leg: No edema.     Left lower leg: No edema.  Lymphadenopathy:     Cervical: No cervical adenopathy.  Skin:    General: Skin is warm and dry.  Neurological:     General: No focal deficit present.     Mental Status: He is alert and oriented to person, place, and time.     Cranial Nerves: No cranial nerve deficit.  Psychiatric:        Mood and Affect: Mood normal.        Behavior: Behavior normal.           Assessment & Plan:

## 2023-04-10 NOTE — Assessment & Plan Note (Signed)
 Pt reports mood is currently stable on Wellbutrin and Fluoxetine.

## 2023-04-10 NOTE — Assessment & Plan Note (Signed)
 Deteriorated.  Pt has gained 5 lbs since last visit.  Encouraged low carb diet and regular exercise.  He is to check w/ insurance if [place] or Zepbound is covered.

## 2023-04-10 NOTE — Assessment & Plan Note (Signed)
 Started on Crestor after pt's last visit.  He reports no issues w/ medication.  Will check LFTs to ensure appropriate metabolism but hope is to continue medication.

## 2023-04-11 LAB — HEPATIC FUNCTION PANEL
AG Ratio: 1.6 (calc) (ref 1.0–2.5)
ALT: 76 U/L — ABNORMAL HIGH (ref 9–46)
AST: 47 U/L — ABNORMAL HIGH (ref 10–40)
Albumin: 4.9 g/dL (ref 3.6–5.1)
Alkaline phosphatase (APISO): 78 U/L (ref 36–130)
Bilirubin, Direct: 0.1 mg/dL (ref 0.0–0.2)
Globulin: 3 g/dL (ref 1.9–3.7)
Indirect Bilirubin: 0.4 mg/dL (ref 0.2–1.2)
Total Bilirubin: 0.5 mg/dL (ref 0.2–1.2)
Total Protein: 7.9 g/dL (ref 6.1–8.1)

## 2023-04-13 ENCOUNTER — Encounter: Payer: Self-pay | Admitting: Family Medicine

## 2023-04-13 NOTE — Telephone Encounter (Signed)
-----   Message from Neena Rhymes sent at 04/13/2023  7:33 AM EDT ----- Liver functions are stable.  No changes at this time

## 2023-04-13 NOTE — Telephone Encounter (Signed)
 Lab results have been discussed.   Verbalized understanding? Yes  Are there any questions? No

## 2023-04-14 ENCOUNTER — Telehealth: Payer: Self-pay

## 2023-04-15 NOTE — Telephone Encounter (Signed)
 Error

## 2023-05-06 ENCOUNTER — Other Ambulatory Visit: Payer: Self-pay | Admitting: Family Medicine

## 2023-05-06 ENCOUNTER — Encounter: Payer: Self-pay | Admitting: Family Medicine

## 2023-05-07 ENCOUNTER — Other Ambulatory Visit: Payer: Self-pay

## 2023-05-07 MED ORDER — SILDENAFIL CITRATE 100 MG PO TABS
50.0000 mg | ORAL_TABLET | Freq: Every day | ORAL | 11 refills | Status: AC | PRN
Start: 2023-05-07 — End: ?

## 2023-05-07 NOTE — Telephone Encounter (Signed)
 Patient is wanting to change pharmacy's for this Sildenafil  medication only, is this okay?

## 2023-05-07 NOTE — Telephone Encounter (Signed)
 Requested Prescriptions   Pending Prescriptions Disp Refills   lisdexamfetamine (VYVANSE ) 50 MG capsule 30 capsule 0    Sig: Take 1 capsule (50 mg total) by mouth daily. BRAND NAME ONLY so pt can use coupon card     Date of patient request: 05/07/2023 Last office visit: 04/10/2023 Upcoming visit: Visit date not found Date of last refill: 04/08/2023 Last refill amount: 30

## 2023-05-08 MED ORDER — LISDEXAMFETAMINE DIMESYLATE 50 MG PO CAPS: 50.0000 mg | ORAL_CAPSULE | Freq: Every day | ORAL | 0 refills | Status: DC

## 2023-05-11 ENCOUNTER — Telehealth: Payer: Self-pay

## 2023-05-11 MED ORDER — FLUOXETINE HCL 40 MG PO CAPS: 40.0000 mg | ORAL_CAPSULE | Freq: Every day | ORAL | 1 refills | Status: DC

## 2023-05-11 NOTE — Telephone Encounter (Signed)
 Done

## 2023-05-11 NOTE — Telephone Encounter (Signed)
 Copied from CRM [id number]. Topic: Clinical - Medication Question >> May 11, 2023  2:33 PM Aisha D wrote: Reason for CRM: Patient's wife Mylinda Asa is calling to get the FLUoxetine  (PROZAC ) 40 MG capsule and rosuvastatin  (CRESTOR ) 20 MG tablet sent to a different pharmacy, she would like to have it sent to [facility]. Mylinda Asa stated the pharmacy has been reaching out but hasn't received a response from the office.

## 2023-05-12 ENCOUNTER — Telehealth: Payer: Self-pay | Admitting: Family Medicine

## 2023-05-12 NOTE — Telephone Encounter (Signed)
 Placed in folder at nurse station

## 2023-05-12 NOTE — Telephone Encounter (Signed)
 Orders was sent from [facility] and needs signatures and sent back to its designated place. Paper work is placed in [employer] loss adjuster.  Please advise, Thanks

## 2023-05-14 MED ORDER — ROSUVASTATIN CALCIUM 20 MG PO TABS: 20.0000 mg | ORAL_TABLET | Freq: Every day | ORAL | 1 refills | Status: DC

## 2023-05-14 NOTE — Telephone Encounter (Signed)
 Refill provided electronically rather than fax back.  No further work to be done

## 2023-06-08 ENCOUNTER — Other Ambulatory Visit: Payer: Self-pay | Admitting: Family Medicine

## 2023-06-08 MED ORDER — LISDEXAMFETAMINE DIMESYLATE 50 MG PO CAPS: 50.0000 mg | ORAL_CAPSULE | Freq: Every day | ORAL | 0 refills | Status: DC

## 2023-06-08 NOTE — Telephone Encounter (Signed)
 Copied from CRM [id number]. Topic: Clinical - Medication Refill >> Jun 08, 2023  9:20 AM Aisha D wrote: Medication: lisdexamfetamine (VYVANSE ) 50 MG capsule  Has the patient contacted their pharmacy? No (Agent: If no, request that the patient contact the pharmacy for the refill. If patient does not wish to contact the pharmacy document the reason why and proceed with request.) (Agent: If yes, when and what did the pharmacy advise?)  This is the patient's preferred pharmacy:  [id number] - [place], [place] - [address] [place] [address] Phone: [phone] Fax: [phone]  [facility] [address] [facility], [place] - [address] [facility] [place] [address] Phone: [phone] Fax: [phone]  Is this the correct pharmacy for this prescription? Yes If no, delete pharmacy and type the correct one.   Has the prescription been filled recently? No  Is the patient out of the medication? Yes  Has the patient been seen for an appointment in the last year OR does the patient have an upcoming appointment? Yes  Can we respond through MyChart? Yes  Agent: Please be advised that Rx refills may take up to 3 business days. We ask that you follow-up with your pharmacy.

## 2023-06-08 NOTE — Telephone Encounter (Signed)
 Requested Prescriptions   Pending Prescriptions Disp Refills   lisdexamfetamine (VYVANSE ) 50 MG capsule 30 capsule 0    Sig: Take 1 capsule (50 mg total) by mouth daily. BRAND NAME ONLY so pt can use coupon card     Date of patient request: 06/08/2023 Last office visit: 04/10/2023 Upcoming visit: no up coming visits  Date of last refill: 05/08/2023 Last refill amount: 30 caps 0 refills

## 2023-07-09 ENCOUNTER — Other Ambulatory Visit: Payer: Self-pay | Admitting: Family Medicine

## 2023-07-09 MED ORDER — LISDEXAMFETAMINE DIMESYLATE 50 MG PO CAPS: 50.0000 mg | ORAL_CAPSULE | Freq: Every day | ORAL | 0 refills | Status: DC

## 2023-07-09 NOTE — Telephone Encounter (Signed)
 Copied from CRM [id number]. Topic: Clinical - Medication Refill >> Jul 09, 2023  8:40 AM Robinson H wrote: Medication: lisdexamfetamine (VYVANSE ) 50 MG capsule  Has the patient contacted their pharmacy? No, Controlled substatnce (Agent: If no, request that the patient contact the pharmacy for the refill. If patient does not wish to contact the pharmacy document the reason why and proceed with request.) (Agent: If yes, when and what did the pharmacy advise?)  This is the patient's preferred pharmacy:  [id number] [place], [place] - [address] [place] [address] Phone: [phone] Fax: [phone]   Is this the correct pharmacy for this prescription? Yes If no, delete pharmacy and type the correct one.   Has the prescription been filled recently? No  Is the patient out of the medication? Yes  Has the patient been seen for an appointment in the last year OR does the patient have an upcoming appointment? Yes  Can we respond through MyChart? Yes  Agent: Please be advised that Rx refills may take up to 3 business days. We ask that you follow-up with your pharmacy.

## 2023-08-17 ENCOUNTER — Other Ambulatory Visit: Payer: Self-pay | Admitting: Family Medicine

## 2023-08-17 MED ORDER — LISDEXAMFETAMINE DIMESYLATE 50 MG PO CAPS: 50.0000 mg | ORAL_CAPSULE | Freq: Every day | ORAL | 0 refills | Status: DC

## 2023-08-17 NOTE — Telephone Encounter (Signed)
 Requested Prescriptions   Pending Prescriptions Disp Refills   lisdexamfetamine (VYVANSE ) 50 MG capsule 30 capsule 0    Sig: Take 1 capsule (50 mg total) by mouth daily. BRAND NAME ONLY so pt can use coupon card     Date of patient request: 08/17/2023 Last office visit: 04/10/2023 Upcoming visit: Visit date not found Date of last refill: 07/09/2023 Last refill amount: 30

## 2023-08-17 NOTE — Telephone Encounter (Signed)
 Copied from CRM #[id number]. Topic: Clinical - Medication Refill >> Aug 17, 2023  8:40 AM Franky GRADE wrote: Medication: lisdexamfetamine (VYVANSE ) 50 MG capsule [[id number]]  Has the patient contacted their pharmacy? No (Agent: If no, request that the patient contact the pharmacy for the refill. If patient does not wish to contact the pharmacy document the reason why and proceed with request.) (Agent: If yes, when and what did the pharmacy advise?)  This is the patient's preferred pharmacy:  [id number] [place], [place] - [address] [place] [address] Phone: [phone] Fax: [phone]    Is this the correct pharmacy for this prescription? Yes If no, delete pharmacy and type the correct one.   Has the prescription been filled recently? No  Is the patient out of the medication? Yes  Has the patient been seen for an appointment in the last year OR does the patient have an upcoming appointment? Yes  Can we respond through MyChart? Yes  Agent: Please be advised that Rx refills may take up to 3 business days. We ask that you follow-up with your pharmacy.

## 2023-09-10 ENCOUNTER — Other Ambulatory Visit: Payer: Self-pay | Admitting: Family Medicine

## 2023-09-21 ENCOUNTER — Other Ambulatory Visit: Payer: Self-pay | Admitting: Family Medicine

## 2023-09-21 MED ORDER — LISDEXAMFETAMINE DIMESYLATE 50 MG PO CAPS: 50.0000 mg | ORAL_CAPSULE | Freq: Every day | ORAL | 0 refills | Status: DC

## 2023-09-21 NOTE — Telephone Encounter (Signed)
 Copied from CRM #[id number]. Topic: Clinical - Medication Refill >> Sep 21, 2023 12:04 PM Burnard DEL wrote: Medication:  lisdexamfetamine (VYVANSE ) 50 MG capsule   Has the patient contacted their pharmacy? No (Agent: If no, request that the patient contact the pharmacy for the refill. If patient does not wish to contact the pharmacy document the reason why and proceed with request.) (Agent: If yes, when and what did the pharmacy advise?)  This is the patient's preferred pharmacy:  [id number] [place], [place] - [address] [place] [address] Phone: [phone] Fax: [phone]   Is this the correct pharmacy for this prescription? Yes If no, delete pharmacy and type the correct one.   Has the prescription been filled recently? No  Is the patient out of the medication? Yes  Has the patient been seen for an appointment in the last year OR does the patient have an upcoming appointment? Yes  Can we respond through MyChart? Yes  Agent: Please be advised that Rx refills may take up to 3 business days. We ask that you follow-up with your pharmacy.

## 2023-09-21 NOTE — Telephone Encounter (Signed)
 Requested Prescriptions   Pending Prescriptions Disp Refills   lisdexamfetamine (VYVANSE ) 50 MG capsule 30 capsule 0    Sig: Take 1 capsule (50 mg total) by mouth daily. BRAND NAME ONLY so pt can use coupon card     Date of patient request: 09/21/2023 Last office visit: 04/10/2023 Upcoming visit: Visit date not found Date of last refill: 08/17/2023 Last refill amount: 30

## 2023-10-20 ENCOUNTER — Other Ambulatory Visit: Payer: Self-pay | Admitting: Family Medicine

## 2023-10-21 NOTE — Telephone Encounter (Signed)
 Pt needs appt.

## 2023-10-26 ENCOUNTER — Telehealth: Admitting: Family Medicine

## 2023-10-26 ENCOUNTER — Encounter: Payer: Self-pay | Admitting: Family Medicine

## 2023-10-26 ENCOUNTER — Other Ambulatory Visit: Payer: Self-pay | Admitting: Family Medicine

## 2023-10-26 DIAGNOSIS — F902 Attention-deficit hyperactivity disorder, combined type: Secondary | ICD-10-CM

## 2023-10-26 MED ORDER — LISDEXAMFETAMINE DIMESYLATE 50 MG PO CAPS: 50.0000 mg | ORAL_CAPSULE | Freq: Every day | ORAL | 0 refills | Status: DC

## 2023-10-26 NOTE — Telephone Encounter (Signed)
 Requested Prescriptions   Pending Prescriptions Disp Refills   lisdexamfetamine (VYVANSE ) 50 MG capsule 30 capsule 0    Sig: Take 1 capsule (50 mg total) by mouth daily. BRAND NAME ONLY so pt can use coupon card     Date of patient request: 10/26/23 Last office visit: 04/10/2023 Upcoming visit: 10/26/2023 Date of last refill: 09/21/23 Last refill amount: 30

## 2023-10-26 NOTE — Telephone Encounter (Signed)
 Copied from CRM [id number]. Topic: Clinical - Medication Refill >> Oct 26, 2023  8:12 AM Macario HERO wrote: Medication: lisdexamfetamine (VYVANSE ) 50 MG capsule [[id number]]  Has the patient contacted their pharmacy? No (Agent: If no, request that the patient contact the pharmacy for the refill. If patient does not wish to contact the pharmacy document the reason why and proceed with request.) (Agent: If yes, when and what did the pharmacy advise?)  This is the patient's preferred pharmacy:  [id number] [place], [place] - [address] [place] [address] Phone: [phone] Fax: [phone]   Is this the correct pharmacy for this prescription? Yes If no, delete pharmacy and type the correct one.   Has the prescription been filled recently? Yes  Is the patient out of the medication? Yes  Has the patient been seen for an appointment in the last year OR does the patient have an upcoming appointment? No  Can we respond through MyChart? Yes  Agent: Please be advised that Rx refills may take up to 3 business days. We ask that you follow-up with your pharmacy.

## 2023-10-26 NOTE — Progress Notes (Signed)
   Virtual Visit via Video   I connected with patient on 10/26/23 at 10:40 AM EDT by a video enabled telemedicine application and verified that I am speaking with the correct person using two identifiers.  Location patient: Home Location provider: [facility], Office Persons participating in the virtual visit: Patient, Provider, CMA (Tonya H)  I discussed the limitations of evaluation and management by telemedicine and the availability of in person appointments. The patient expressed understanding and agreed to proceed.  Subjective:   HPI:   ADHD- chronic problem.  Currently on Vyvanse  50mg  daily as well as Wellbutrin  300mg  daily.  Feels that sxs are currently well controlled.  Pt reports that work is going well- able to focus and complete tasks.  Anxiety is manageable.    ROS:   See pertinent positives and negatives per HPI.  Patient Active Problem List   Diagnosis Date Noted   Hyperlipidemia 04/10/2023   Erectile dysfunction 02/27/2023   Mild obstructive sleep apnea 04/30/2021   ADHD (attention deficit hyperactivity disorder), combined type 10/21/2018   Anxiety and depression 10/21/2018   Binge eating 10/21/2018   Morbid obesity (HCC) 04/08/2017   Bleeding hemorrhoid 08/09/2015   Physical exam 02/02/2015   Wheezing 09/15/2013   Lumbar back pain 02/18/2013    Social History   Tobacco Use   Smoking status: Never   Smokeless tobacco: Never  Substance Use Topics   Alcohol use: Yes    Comment: ocassionally    Current Outpatient Medications:    albuterol  (VENTOLIN  HFA) 108 (90 Base) MCG/ACT inhaler, INHALE TWO PUFFS BY MOUTH EVERY 6 HOURS AS NEEDED FOR  WHEEZING  OR  SHORTNESS  OF  BREATH, Disp: 8 g, Rfl: 1   buPROPion  (WELLBUTRIN  XL) 300 MG 24 hr tablet, TAKE 1 TABLET BY MOUTH DAILY, Disp: 30 tablet, Rfl: 11   FLUoxetine  (PROZAC ) 40 MG capsule, Take 1 capsule (40 mg total) by mouth daily., Disp: 90 capsule, Rfl: 1   levocetirizine (XYZAL ) 5 MG tablet, TAKE 1  TABLET BY MOUTH IN THE  EVENING, Disp: 90 tablet, Rfl: 1   lisdexamfetamine (VYVANSE ) 50 MG capsule, Take 1 capsule (50 mg total) by mouth daily. BRAND NAME ONLY so pt can use coupon card, Disp: 30 capsule, Rfl: 0   pantoprazole  (PROTONIX ) 40 MG tablet, TAKE 1 TABLET BY MOUTH DAILY, Disp: 90 tablet, Rfl: 3   rosuvastatin  (CRESTOR ) 20 MG tablet, Take 1 tablet (20 mg total) by mouth daily., Disp: 90 tablet, Rfl: 1   sildenafil  (VIAGRA ) 100 MG tablet, Take 0.5-1 tablets (50-100 mg total) by mouth daily as needed for erectile dysfunction., Disp: 10 tablet, Rfl: 11  No Known Allergies  Objective:   There were no vitals taken for this visit. AAOx3, NAD NCAT, EOMI No obvious CN deficits Coloring WNL Pt is able to speak clearly, coherently without shortness of breath or increased work of breathing.  Thought process is linear.  Mood is appropriate.   Assessment and Plan:   ADHD- ongoing issue.  Currently doing well on Vyvanse  and Wellbutrin .  No side effects.  Sxs controlled.  Refill sent to pharmacy.   Comer Greet, MD 10/26/2023

## 2023-11-11 ENCOUNTER — Other Ambulatory Visit: Payer: Self-pay | Admitting: Family Medicine

## 2023-11-11 DIAGNOSIS — J069 Acute upper respiratory infection, unspecified: Secondary | ICD-10-CM

## 2023-11-11 NOTE — Telephone Encounter (Signed)
 Called patient to schedule follow up appt. Appt needs to be scheduled before refills are sent in

## 2023-11-24 ENCOUNTER — Encounter: Payer: Self-pay | Admitting: Family Medicine

## 2023-11-24 MED ORDER — LISDEXAMFETAMINE DIMESYLATE 50 MG PO CAPS: 50.0000 mg | ORAL_CAPSULE | Freq: Every day | ORAL | 0 refills | Status: DC

## 2023-11-24 NOTE — Addendum Note (Signed)
 Addended by: Zorion Nims E on: 11/24/2023 02:20 PM   Modules accepted: Orders

## 2024-01-05 ENCOUNTER — Encounter: Payer: Self-pay | Admitting: Family Medicine

## 2024-01-05 MED ORDER — LISDEXAMFETAMINE DIMESYLATE 50 MG PO CAPS: 50.0000 mg | ORAL_CAPSULE | Freq: Every day | ORAL | 0 refills | Status: AC

## 2024-01-05 NOTE — Telephone Encounter (Signed)
 Requested Prescriptions   Pending Prescriptions Disp Refills   lisdexamfetamine  (VYVANSE ) 50 MG capsule 30 capsule 0    Sig: Take 1 capsule (50 mg total) by mouth daily. BRAND NAME ONLY so pt can use coupon card     Date of patient request: 01/05/24 Last office visit: 04/10/2023 Upcoming visit: Visit date not found Date of last refill: 11/24/23 Last refill amount: 30

## 2024-01-14 ENCOUNTER — Encounter: Payer: Self-pay | Admitting: Family Medicine

## 2024-01-15 ENCOUNTER — Ambulatory Visit (HOSPITAL_BASED_OUTPATIENT_CLINIC_OR_DEPARTMENT_OTHER)
Admission: RE | Admit: 2024-01-15 | Discharge: 2024-01-15 | Disposition: A | Discharge status: Home / Self Care | Attending: Family Medicine | Admitting: Family Medicine

## 2024-01-15 ENCOUNTER — Encounter (HOSPITAL_BASED_OUTPATIENT_CLINIC_OR_DEPARTMENT_OTHER): Payer: Self-pay

## 2024-01-15 VITALS — BP 148/86 | HR 98 | Temp 98.3°F | Resp 20

## 2024-01-15 DIAGNOSIS — H66001 Acute suppurative otitis media without spontaneous rupture of ear drum, right ear: Secondary | ICD-10-CM | POA: Diagnosis not present

## 2024-01-15 DIAGNOSIS — H9201 Otalgia, right ear: Secondary | ICD-10-CM

## 2024-01-15 DIAGNOSIS — H60391 Other infective otitis externa, right ear: Secondary | ICD-10-CM | POA: Diagnosis not present

## 2024-01-15 MED ORDER — CIPROFLOXACIN-DEXAMETHASONE 0.3-0.1 % OT SUSP: 4.0000 [drp] | Freq: Two times a day (BID) | OTIC | 0 refills | Status: AC

## 2024-01-15 MED ORDER — CEFDINIR 300 MG PO CAPS: 300.0000 mg | ORAL_CAPSULE | Freq: Two times a day (BID) | ORAL | 0 refills | Status: AC

## 2024-01-15 NOTE — ED Provider Notes (Signed)
 " Raymond Santana    CSN: [id number] Arrival date & time: 01/15/24  1803      History   Chief Complaint Chief Complaint  Patient presents with   Ear Drainage    Right ear pain and white discharge - Entered by patient   Otalgia    HPI Raymond Santana is a 34 y.o. male.   Patient thought he was getting over a cold in late December.  On 01/07/2024, he noticed ear pain.  On 01/10/2024 he noticed discharge from his ear.  He does not know if he has an ear infection or swimmer's ear or both.  He has a little bit of a runny nose but overall his cold and sinus symptoms have resolved.   Ear Drainage Pertinent negatives include no chest pain and no abdominal pain.  Otalgia Associated symptoms: congestion and ear discharge   Associated symptoms: no abdominal pain, no cough, no diarrhea, no fever, no rash, no sore throat and no vomiting     Past Medical History:  Diagnosis Date   ADHD    Asthma    Bruxism    Depression with anxiety    Fatty liver    Hemorrhoid 2017   Hypertension    Pilonidal cyst     Patient Active Problem List   Diagnosis Date Noted   Hyperlipidemia 04/10/2023   Erectile dysfunction 02/27/2023   Mild obstructive sleep apnea 04/30/2021   ADHD (attention deficit hyperactivity disorder), combined type 10/21/2018   Anxiety and depression 10/21/2018   Binge eating 10/21/2018   Morbid obesity (HCC) 04/08/2017   Bleeding hemorrhoid 08/09/2015   Physical exam 02/02/2015   Wheezing 09/15/2013   Lumbar back pain 02/18/2013    Past Surgical History:  Procedure Laterality Date   APPENDECTOMY     CYSTECTOMY     Buttock   Staph infection     L toe   VASECTOMY  03/06/2021   WISDOM TOOTH EXTRACTION Bilateral        Home Medications    Prior to Admission medications  Medication Sig Start Date End Date Taking? Authorizing Provider  buPROPion  (WELLBUTRIN  XL) 300 MG 24 hr tablet TAKE 1 TABLET BY MOUTH DAILY 10/21/23  Yes Tabori, Katherine E, MD   cefdinir  (OMNICEF ) 300 MG capsule Take 1 capsule (300 mg total) by mouth 2 (two) times daily after a meal for 10 days. 01/15/24 01/25/24 Yes Ival Domino, FNP  ciprofloxacin -dexamethasone  (CIPRODEX ) OTIC suspension Place 4 drops into the right ear 2 (two) times daily. 01/15/24  Yes Ival Domino, FNP  FLUoxetine  (PROZAC ) 40 MG capsule TAKE 1 CAPSULE BY MOUTH DAILY 11/11/23  Yes Tabori, Katherine E, MD  lisdexamfetamine  (VYVANSE ) 50 MG capsule Take 1 capsule (50 mg total) by mouth daily. BRAND NAME ONLY so pt can use coupon card 01/05/24  Yes Mahlon Comer BRAVO, MD  pantoprazole  (PROTONIX ) 40 MG tablet TAKE 1 TABLET BY MOUTH DAILY 04/08/23  Yes Tabori, Katherine E, MD  rosuvastatin  (CRESTOR ) 20 MG tablet TAKE 1 TABLET BY MOUTH DAILY 11/11/23  Yes Tabori, Katherine E, MD  albuterol  (VENTOLIN  HFA) 108 [phone] Base) MCG/ACT inhaler INHALE TWO PUFFS BY MOUTH EVERY 6 HOURS AS NEEDED FOR  WHEEZING  OR  SHORTNESS  OF  BREATH 06/05/20   Tabori, Katherine E, MD  levocetirizine (XYZAL ) 5 MG tablet TAKE 1 TABLET BY MOUTH EVERY  EVENING 11/11/23   Tabori, Katherine E, MD  sildenafil  (VIAGRA ) 100 MG tablet Take 0.5-1 tablets (50-100 mg total) by mouth daily as needed  for erectile dysfunction. 05/07/23   Mahlon Comer BRAVO, MD    Family History Family History  Problem Relation Age of Onset   Diabetes Mother        mother's side of family   Hypertension Mother        mother's side of family   Barrett's esophagus Father    Diabetes Other        father's side of family, Paternal Great aunt   Colon cancer Paternal Grandmother     Social History Social History[1]   Allergies   Patient has no known allergies.   Review of Systems Review of Systems  Constitutional:  Negative for chills and fever.  HENT:  Positive for congestion, ear discharge and ear pain. Negative for sore throat.   Eyes:  Negative for pain and visual disturbance.  Respiratory:  Negative for cough.   Cardiovascular:  Negative for chest pain and  palpitations.  Gastrointestinal:  Negative for abdominal pain, constipation, diarrhea, nausea and vomiting.  Genitourinary:  Negative for dysuria and hematuria.  Musculoskeletal:  Negative for arthralgias and back pain.  Skin:  Negative for color change and rash.  Neurological:  Negative for seizures and syncope.  All other systems reviewed and are negative.    Physical Exam Triage Vital Signs ED Triage Vitals  Encounter Vitals Group     BP 01/15/24 1846 (!) 148/86     Girls Systolic BP Percentile --      Girls Diastolic BP Percentile --      Boys Systolic BP Percentile --      Boys Diastolic BP Percentile --      Pulse Rate 01/15/24 1846 98     Resp 01/15/24 1846 20     Temp 01/15/24 1846 98.3 F (36.8 C)     Temp Source 01/15/24 1846 Oral     SpO2 01/15/24 1846 95 %     Weight --      Height --      Head Circumference --      Peak Flow --      Pain Score 01/15/24 1844 4     Pain Loc --      Pain Education --      Exclude from Growth Chart --    No data found.  Updated Vital Signs BP (!) 148/86 (BP Location: Right Arm)   Pulse 98   Temp 98.3 F (36.8 C) (Oral)   Resp 20   SpO2 95%   Visual Acuity Right Eye Distance:   Left Eye Distance:   Bilateral Distance:    Right Eye Near:   Left Eye Near:    Bilateral Near:     Physical Exam Vitals and nursing note reviewed.  Constitutional:      General: He is not in acute distress.    Appearance: He is well-developed. He is not ill-appearing, toxic-appearing or diaphoretic.  HENT:     Head: Normocephalic and atraumatic.     Right Ear: Hearing and external ear normal. Drainage (Minimal amount of purulent drainage from the skin of the ear canal.), swelling and tenderness present. A middle ear effusion is present. Tympanic membrane is erythematous and bulging.     Left Ear: Hearing, tympanic membrane, ear canal and external ear normal.     Nose: No congestion or rhinorrhea.     Right Sinus: No maxillary sinus  tenderness or frontal sinus tenderness.     Left Sinus: No maxillary sinus tenderness or frontal sinus tenderness.  Mouth/Throat:     Lips: Pink.     Mouth: Mucous membranes are moist.     Pharynx: Uvula midline. No oropharyngeal exudate or posterior oropharyngeal erythema.     Tonsils: No tonsillar exudate.  Eyes:     Conjunctiva/sclera: Conjunctivae normal.     Pupils: Pupils are equal, round, and reactive to light.  Cardiovascular:     Rate and Rhythm: Normal rate and regular rhythm.     Heart sounds: S1 normal and S2 normal. No murmur heard. Pulmonary:     Effort: Pulmonary effort is normal. No respiratory distress.     Breath sounds: Normal breath sounds. No decreased breath sounds, wheezing, rhonchi or rales.  Abdominal:     General: Bowel sounds are normal.     Palpations: Abdomen is soft.     Tenderness: There is no abdominal tenderness.  Musculoskeletal:        General: No swelling.     Cervical back: Neck supple.  Lymphadenopathy:     Head:     Right side of head: Preauricular and posterior auricular adenopathy present. No submental, submandibular or tonsillar adenopathy.     Left side of head: No submental, submandibular, tonsillar, preauricular or posterior auricular adenopathy.     Cervical: Cervical adenopathy present.     Right cervical: Superficial cervical adenopathy present.     Left cervical: No superficial cervical adenopathy.  Skin:    General: Skin is warm and dry.     Capillary Refill: Capillary refill takes less than 2 seconds.     Findings: No rash.  Neurological:     Mental Status: He is alert and oriented to person, place, and time.  Psychiatric:        Mood and Affect: Mood normal.      UC Treatments / Results  Labs (all labs ordered are listed, but only abnormal results are displayed) Labs Reviewed - No data to display  EKG   Radiology No results found.  Procedures Procedures (including critical Santana time)  Medications Ordered in  UC Medications - No data to display  Initial Impression / Assessment and Plan / UC Course  I have reviewed the triage vital signs and the nursing notes.  Pertinent labs & imaging results that were available during my Santana of the patient were reviewed by me and considered in my medical decision making (see chart for details).  Plan of Santana (see discharge instructions for additional patient precautions and education): Inner ear infection right ear with ear pain: Cefdinir  300 mg twice daily for 10 days.  Get plenty of fluids and rest.  Acetaminophen or ibuprofen if needed for ear pain.  External ear infection on the right: Ciprodex  drops, 4 drops twice daily for 7 to 10 days.  Do not use rinses or Q-tips in the ear.  Encouraged to make an appointment with primary Santana to recheck right ear in 2 to 3 weeks or return here for recheck.  I reviewed the plan of Santana with the patient and/or the patient's guardian.  The patient and/or guardian had time to ask questions and acknowledged that the questions were answered.  Final Clinical Impressions(s) / UC Diagnoses   Final diagnoses:  Non-recurrent acute suppurative otitis media of right ear without spontaneous rupture of tympanic membrane  Infective otitis externa of right ear  Otalgia, right ear     Discharge Instructions      Inner ear infection right ear with ear pain: Cefdinir  300 mg twice daily for 10  days.  Get plenty of fluids and rest.  Acetaminophen or ibuprofen if needed for ear pain.  External ear infection on the right: Ciprodex  drops, 4 drops twice daily for 7 to 10 days.  Do not use rinses or Q-tips in the ear.  Encouraged to make an appointment with primary Santana to recheck right ear in 2 to 3 weeks or return here for recheck.     ED Prescriptions     Medication Sig Dispense Auth. Provider   cefdinir  (OMNICEF ) 300 MG capsule Take 1 capsule (300 mg total) by mouth 2 (two) times daily after a meal for 10 days. 20 capsule  Ival Domino, FNP   ciprofloxacin -dexamethasone  (CIPRODEX ) OTIC suspension Place 4 drops into the right ear 2 (two) times daily. 7.5 mL Ival Domino, FNP      PDMP not reviewed this encounter.    [1]  Social History Tobacco Use   Smoking status: Never   Smokeless tobacco: Never  Vaping Use   Vaping status: Never Used  Substance Use Topics   Alcohol use: Yes    Comment: ocassionally   Drug use: No     Ival Domino, FNP 01/15/24 1903  "

## 2024-01-15 NOTE — Discharge Instructions (Addendum)
 Inner ear infection right ear with ear pain: Cefdinir  300 mg twice daily for 10 days.  Get plenty of fluids and rest.  Acetaminophen or ibuprofen if needed for ear pain.  External ear infection on the right: Ciprodex  drops, 4 drops twice daily for 7 to 10 days.  Do not use rinses or Q-tips in the ear.  Encouraged to make an appointment with primary care to recheck right ear in 2 to 3 weeks or return here for recheck.

## 2024-01-15 NOTE — ED Triage Notes (Signed)
 Pt c/o right ear pain and he has noticed white discharge for almost a week.

## 2024-01-16 ENCOUNTER — Ambulatory Visit

## 2024-01-18 ENCOUNTER — Ambulatory Visit: Admitting: Family Medicine
